# Patient Record
Sex: Male | Born: 1980 | Race: White | Hispanic: No | Marital: Married | Smoking: Former smoker
Health system: Southern US, Community
[De-identification: ages and names within clinical notes are randomized; demographics above are authoritative.]

## PROBLEM LIST (undated history)

## (undated) DIAGNOSIS — E782 Mixed hyperlipidemia: Secondary | ICD-10-CM

## (undated) DIAGNOSIS — I10 Essential (primary) hypertension: Secondary | ICD-10-CM

## (undated) DIAGNOSIS — K219 Gastro-esophageal reflux disease without esophagitis: Secondary | ICD-10-CM

## (undated) DIAGNOSIS — F411 Generalized anxiety disorder: Secondary | ICD-10-CM

## (undated) DIAGNOSIS — E785 Hyperlipidemia, unspecified: Secondary | ICD-10-CM

## (undated) HISTORY — DX: Generalized anxiety disorder: F41.1

## (undated) HISTORY — PX: UPPER GASTROINTESTINAL ENDOSCOPY: SHX188

## (undated) HISTORY — DX: Gastro-esophageal reflux disease without esophagitis: K21.9

## (undated) HISTORY — PX: WISDOM TOOTH EXTRACTION: SHX21

## (undated) HISTORY — DX: Mixed hyperlipidemia: E78.2

## (undated) HISTORY — DX: Essential (primary) hypertension: I10

## (undated) HISTORY — PX: OTHER SURGICAL HISTORY: SHX169

## (undated) HISTORY — DX: Hyperlipidemia, unspecified: E78.5

---

## 2001-11-28 ENCOUNTER — Emergency Department (HOSPITAL_COMMUNITY): Admission: EM | Admit: 2001-11-28 | Discharge: 2001-11-28 | Payer: Self-pay | Admitting: Emergency Medicine

## 2007-04-21 ENCOUNTER — Ambulatory Visit: Payer: Self-pay | Admitting: Sports Medicine

## 2007-04-21 DIAGNOSIS — M79609 Pain in unspecified limb: Secondary | ICD-10-CM | POA: Insufficient documentation

## 2007-04-21 DIAGNOSIS — L84 Corns and callosities: Secondary | ICD-10-CM | POA: Insufficient documentation

## 2007-08-14 ENCOUNTER — Ambulatory Visit: Payer: Self-pay | Admitting: Sports Medicine

## 2007-08-14 DIAGNOSIS — M545 Low back pain, unspecified: Secondary | ICD-10-CM | POA: Insufficient documentation

## 2007-08-14 DIAGNOSIS — R030 Elevated blood-pressure reading, without diagnosis of hypertension: Secondary | ICD-10-CM | POA: Insufficient documentation

## 2007-08-14 DIAGNOSIS — S43429A Sprain of unspecified rotator cuff capsule, initial encounter: Secondary | ICD-10-CM | POA: Insufficient documentation

## 2007-08-14 DIAGNOSIS — M549 Dorsalgia, unspecified: Secondary | ICD-10-CM | POA: Insufficient documentation

## 2011-06-25 DIAGNOSIS — I10 Essential (primary) hypertension: Secondary | ICD-10-CM

## 2011-06-25 HISTORY — DX: Essential (primary) hypertension: I10

## 2016-01-22 DIAGNOSIS — I10 Essential (primary) hypertension: Secondary | ICD-10-CM | POA: Diagnosis not present

## 2016-01-23 DIAGNOSIS — E785 Hyperlipidemia, unspecified: Secondary | ICD-10-CM

## 2016-01-23 HISTORY — DX: Hyperlipidemia, unspecified: E78.5

## 2016-04-16 DIAGNOSIS — Z6825 Body mass index (BMI) 25.0-25.9, adult: Secondary | ICD-10-CM | POA: Diagnosis not present

## 2016-04-16 DIAGNOSIS — Z0000 Encounter for general adult medical examination without abnormal findings: Secondary | ICD-10-CM | POA: Diagnosis not present

## 2016-09-05 DIAGNOSIS — J018 Other acute sinusitis: Secondary | ICD-10-CM | POA: Diagnosis not present

## 2017-01-10 DIAGNOSIS — J342 Deviated nasal septum: Secondary | ICD-10-CM | POA: Diagnosis not present

## 2017-01-10 DIAGNOSIS — K219 Gastro-esophageal reflux disease without esophagitis: Secondary | ICD-10-CM | POA: Diagnosis not present

## 2017-01-10 DIAGNOSIS — I10 Essential (primary) hypertension: Secondary | ICD-10-CM | POA: Diagnosis not present

## 2017-01-10 DIAGNOSIS — E782 Mixed hyperlipidemia: Secondary | ICD-10-CM | POA: Diagnosis not present

## 2017-01-14 ENCOUNTER — Encounter: Payer: Self-pay | Admitting: Physician Assistant

## 2017-01-21 DIAGNOSIS — J029 Acute pharyngitis, unspecified: Secondary | ICD-10-CM | POA: Diagnosis not present

## 2017-01-21 DIAGNOSIS — J342 Deviated nasal septum: Secondary | ICD-10-CM | POA: Diagnosis not present

## 2017-01-28 ENCOUNTER — Encounter: Payer: Self-pay | Admitting: Physician Assistant

## 2017-01-28 ENCOUNTER — Encounter (INDEPENDENT_AMBULATORY_CARE_PROVIDER_SITE_OTHER): Payer: Self-pay

## 2017-01-28 ENCOUNTER — Ambulatory Visit (INDEPENDENT_AMBULATORY_CARE_PROVIDER_SITE_OTHER): Payer: BLUE CROSS/BLUE SHIELD | Admitting: Physician Assistant

## 2017-01-28 VITALS — BP 126/82 | HR 60 | Ht 77.0 in | Wt 221.5 lb

## 2017-01-28 DIAGNOSIS — K219 Gastro-esophageal reflux disease without esophagitis: Secondary | ICD-10-CM

## 2017-01-28 DIAGNOSIS — R490 Dysphonia: Secondary | ICD-10-CM | POA: Diagnosis not present

## 2017-01-28 DIAGNOSIS — R1314 Dysphagia, pharyngoesophageal phase: Secondary | ICD-10-CM

## 2017-01-28 MED ORDER — PANTOPRAZOLE SODIUM 40 MG PO TBEC: 40.0000 mg | DELAYED_RELEASE_TABLET | Freq: Every day | ORAL | 1 refills | Status: DC

## 2017-01-28 NOTE — Patient Instructions (Signed)
We have sent the following medications to your pharmacy for you to pick up at your convenience: Pantoprazole 40 mg daily    Your physician has requested that you go to the basement for the following lab work before leaving today: H pylori stool antigen  We have given you a handout on anti-reflux regimen.

## 2017-01-28 NOTE — Progress Notes (Signed)
Chief Complaint: GERD, Hoarseness, Intermittent Dysphagia  HPI:  Mr. Wiler is a 36 year old Caucasian male who presents to clinic today as a referral from Gus Height, PA-C at [facility], for a complaint of reflux, hoarseness and intermittent dysphagia.   Today, the patient tells me that 2 months ago he started with postnasal drip related to a deviated septum, this seemed to make his throat sore, but he has been to see an ENT and have this evaluated and is now on Flonase. This is under control but patient continues with a daily hoarseness and the ENT suggested this could be from reflux.     Patient also describes a "lump in my throat", apparently 2-3 meals a week seem to get stuck in one spot and he has to take a large gulp of water in order to get them down. Patient has been using when necessary Zantac which "may be help", he has not taken this that frequently.   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 Hoarseness is affecting his speech on the job. Patient also drinks 4-5 craft beers a day due to having 12-year-old twins at home per his report. He plans to start running again soon instead as an outlet.   Patient denies fever, chills, blood in the stool, melena, weight loss, fatigue, anorexia, change in bowel habits, nausea, vomiting or symptoms that awaken him at night.   Past Medical History:  Diagnosis Date  . Hyperlipidemia 01/23/2016  . Hypertension 2013    Past Surgical History:  Procedure Laterality Date  . None      Current Outpatient Prescriptions  Medication Sig Dispense Refill  . nebivolol (BYSTOLIC) 5 MG tablet Take 5 mg by mouth daily.    . ranitidine (ZANTAC) 150 MG tablet Take 150 mg by mouth as needed for heartburn.     No current facility-administered medications for this visit.     Allergies as of 01/28/2017 - never reviewed  Allergen Reaction Noted  . Solarcaine aloe extra [lidocaine] Other (See Comments) 01/28/2017    Family  History  Problem Relation Age of Onset  . Breast cancer Mother   . Healthy Father   . Heart attack Maternal Grandfather   . Dementia Paternal Grandmother   . Other Paternal Grandfather        suicide  . Pancreatic disease Other        great aunt  . Colon cancer Neg Hx   . Rectal cancer Neg Hx   . Esophageal cancer Neg Hx   . Liver cancer Neg Hx     Social History   Social History  . Marital status: Married    Spouse name: N/A  . Number of children: 2  . Years of education: N/A   Occupational History  . Sales    Social History Main Topics  . Smoking status: Former Games developer  . Smokeless tobacco: Former Neurosurgeon    Types: Snuff     Comment: during college  . Alcohol use 2.4 oz/week    4 Cans of beer per week     Comment: 4-5 cans daily  . Drug use: No  . Sexual activity: Not on file   Other Topics Concern  . Not on file   Social History Narrative  . No narrative on file    Review of Systems:    Constitutional: No weight loss, fever or chills Skin: No rash  Cardiovascular: No chest pain Respiratory: No SOB or cough Gastrointestinal: See HPI and  otherwise negative Genitourinary: No dysuria Neurological: No headache, dizziness or syncope Musculoskeletal: No new muscle or joint pain Hematologic: No bleeding  Psychiatric: No history of depression or anxiety   Physical Exam:  Vital signs: BP 126/82   Pulse 60   Ht 6\' 5"  (1.956 m)   Wt 221 lb 8 oz (100.5 kg)   BMI 26.27 kg/m   Constitutional:   Pleasant Caucasian male appears to be in NAD, Well developed, Well nourished, alert and cooperative Head:  Normocephalic and atraumatic. Eyes:   PEERL, EOMI. No icterus. Conjunctiva pink. Ears:  Normal auditory acuity. Neck:  Supple Throat: Oral cavity and pharynx without inflammation, swelling or lesion.  Respiratory: Respirations even and unlabored. Lungs clear to auscultation bilaterally.   No wheezes, crackles, or rhonchi.  Cardiovascular: Normal S1, S2. No MRG.  Regular rate and rhythm. No peripheral edema, cyanosis or pallor.  Gastrointestinal:  Soft, nondistended, nontender. No rebound or guarding. Normal bowel sounds. No appreciable masses or hepatomegaly. Rectal:  Not performed.  Msk:  Symmetrical without gross deformities. Without edema, no deformity or joint abnormality.  Neurologic:  Alert and  oriented x4;  grossly normal neurologically.  Skin:   Dry and intact without significant lesions or rashes. Psychiatric:  Demonstrates good judgement and reason without abnormal affect or behaviors.  No recent labs or imaging  Assessment: 1. GERD: Patient reports a burning sensation up into his throat causing daily hoarseness, worse in the past 2 months 2. Hoarseness: For the past 2 months upon waking, initially started after postnasal drip, consider relation to above 3. Intermittent dysphagia: 2-3 meals a week he tends to have problems with getting stuck on its way down, relieved with a gulp of water; consider esophagitis versus esophageal stricture versus less likely ring versus web versus mass  Plan: 1. Discussed with the patient that we could do an EGD for further evaluation of his symptoms but he declined at this time. 2. Prescribed Pantoprazole 40 mg once daily, 30-60 minutes before eating breakfast #30 with 1 refill 3. Provided the patient with a reflux handout and reviewed antireflux diet and lifestyle recommendations such as decreasing his alcohol intake 4. Patient will follow in clinic in 4-5 weeks with myself or Dr. Lavon PaganiniNandigam, if he continues with symptoms at that time, will discuss EGD  Hyacinth MeekerJennifer Azarel Banner, PA-C [facility] 01/28/2017, 9:13 AM  Cc: Gus HeightAndrea Johnson, PA-C ([facility])

## 2017-01-29 NOTE — Progress Notes (Signed)
Reviewed and agree with documentation and assessment and plan. K. Veena Nandigam , MD   

## 2017-02-19 ENCOUNTER — Other Ambulatory Visit: Payer: BLUE CROSS/BLUE SHIELD

## 2017-02-19 DIAGNOSIS — R1314 Dysphagia, pharyngoesophageal phase: Secondary | ICD-10-CM

## 2017-02-19 DIAGNOSIS — R490 Dysphonia: Secondary | ICD-10-CM

## 2017-02-19 DIAGNOSIS — K219 Gastro-esophageal reflux disease without esophagitis: Secondary | ICD-10-CM

## 2017-02-20 LAB — HELICOBACTER PYLORI  SPECIAL ANTIGEN: "H. PYLORI Antigen ": NOT DETECTED

## 2017-03-24 ENCOUNTER — Other Ambulatory Visit: Payer: Self-pay | Admitting: Physician Assistant

## 2017-04-07 DIAGNOSIS — Z0000 Encounter for general adult medical examination without abnormal findings: Secondary | ICD-10-CM | POA: Diagnosis not present

## 2017-04-07 DIAGNOSIS — J018 Other acute sinusitis: Secondary | ICD-10-CM | POA: Diagnosis not present

## 2017-04-07 DIAGNOSIS — Z6827 Body mass index (BMI) 27.0-27.9, adult: Secondary | ICD-10-CM | POA: Diagnosis not present

## 2017-04-22 ENCOUNTER — Other Ambulatory Visit: Payer: Self-pay

## 2017-04-22 MED ORDER — PANTOPRAZOLE SODIUM 40 MG PO TBEC: 40.0000 mg | DELAYED_RELEASE_TABLET | Freq: Every day | ORAL | 3 refills | Status: DC

## 2017-10-03 DIAGNOSIS — J020 Streptococcal pharyngitis: Secondary | ICD-10-CM | POA: Diagnosis not present

## 2017-10-30 DIAGNOSIS — J00 Acute nasopharyngitis [common cold]: Secondary | ICD-10-CM | POA: Diagnosis not present

## 2018-03-12 ENCOUNTER — Encounter: Payer: Self-pay | Admitting: Physician Assistant

## 2018-03-12 ENCOUNTER — Encounter (INDEPENDENT_AMBULATORY_CARE_PROVIDER_SITE_OTHER): Payer: Self-pay

## 2018-03-12 ENCOUNTER — Other Ambulatory Visit (INDEPENDENT_AMBULATORY_CARE_PROVIDER_SITE_OTHER): Payer: BLUE CROSS/BLUE SHIELD

## 2018-03-12 ENCOUNTER — Ambulatory Visit: Payer: BLUE CROSS/BLUE SHIELD | Admitting: Physician Assistant

## 2018-03-12 VITALS — BP 134/90 | HR 60 | Ht 75.59 in | Wt 234.5 lb

## 2018-03-12 DIAGNOSIS — R194 Change in bowel habit: Secondary | ICD-10-CM | POA: Diagnosis not present

## 2018-03-12 DIAGNOSIS — K649 Unspecified hemorrhoids: Secondary | ICD-10-CM

## 2018-03-12 DIAGNOSIS — R1031 Right lower quadrant pain: Secondary | ICD-10-CM

## 2018-03-12 LAB — IGA: IgA: 190 mg/dL (ref 68–378)

## 2018-03-12 MED ORDER — HYOSCYAMINE SULFATE 0.125 MG SL SUBL: 0.1250 mg | SUBLINGUAL_TABLET | SUBLINGUAL | 1 refills | Status: DC | PRN

## 2018-03-12 MED ORDER — HYDROCORTISONE 2.5 % RE CREA: 1.0000 "application " | TOPICAL_CREAM | Freq: Two times a day (BID) | RECTAL | 1 refills | Status: DC

## 2018-03-12 NOTE — Patient Instructions (Addendum)
Your provider has requested that you go to the basement level for lab work before leaving today. Press "B" on the elevator. The lab is located at the first door on the left as you exit the elevator.  We have sent the following medications to your pharmacy for you to pick up at your convenience:  Hydrocortisone cream twice a day for 7 days  Hyoscyamine 0.125 mg every 4-6 hours as needed  We have given you a high fiber diet handout. Please strive to have 25-30 grams of fiber daily.   Use a daily probiotic such as Align daily for 2 months.

## 2018-03-12 NOTE — Progress Notes (Signed)
Chief Complaint: Abdominal pain, hemorrhoids, varying bowel habits  HPI:    Edward Terry is a 37 year old male with a past medical history as listed below, assigned to Dr. Lavon Paganini at time of patient's last visit, who presents clinic today with a complaint of abdominal pain, hemorrhoids and varying bowel habits.    01/28/2017 office visit with me to discuss GERD, hoarseness and intermittent dysphagia.  At that time, started patient on Pantoprazole 40 mg daily.    Today, patient presents to clinic and has multiple GI complaints.  Over the past couple of months he has had an acute right lower quadrant abdominal pain which seems to come and go.  Tells me that when this comes it can last for a couple of hours.  It is rated as a 2-3/10.  Tells me that if he does not eat anything, he gets very gassy and then this pain is worse.  It is now worrying him because "it is always in the very same spot".  Nothing seems to make it better.  Does hurt somewhat when he gets up and tries to move around.    Also discusses varying bowel habits today.  Tells me that he can have loose stools occasionally after eating certain foods and in fact has started to avoid gluten foods as he feels like this makes his symptoms worse.  Tells me that he can have 4-6 stools typically in the morning and afterwards he is fine all day.  Does report excess bloating and gas.  Associated symptoms include a "sick feeling when I am hungry" this has been occurring over the past couple of months too.     Also discusses some occasional bright red blood and itching, painful hemorrhoids which come and go.  He typically uses Preparation H over-the-counter which resolves his symptoms after about a week.     Patient has questions regarding food allergies.     Interestingly patient had to stop exercising about 3 to 4 months ago because of plantar fasciitis and believes that a lot of his symptoms started around the same time.  Social history positive for the  patient and his wife going on a trip to [place] in a couple of weeks.  He would like to feel well then.  (The twins are staying at home).     Denies fever, chills, weight loss, anorexia, nausea, vomiting, heartburn, reflux or symptoms that awaken him from sleep.  Past Medical History:  Diagnosis Date  . Hyperlipidemia 01/23/2016  . Hypertension 2013    Past Surgical History:  Procedure Laterality Date  . None      Current Outpatient Medications  Medication Sig Dispense Refill  . atorvastatin (LIPITOR) 10 MG tablet Take 1 tablet by mouth daily.  0  . nebivolol (BYSTOLIC) 5 MG tablet Take 5 mg by mouth daily.    . pantoprazole (PROTONIX) 40 MG tablet Take 1 tablet (40 mg total) by mouth daily. 30-60 mins before breakfast 90 tablet 3   No current facility-administered medications for this visit.     Allergies as of 03/12/2018 - Review Complete 03/12/2018  Allergen Reaction Noted  . Solarcaine aloe extra [lidocaine] Other (See Comments) 01/28/2017    Family History  Problem Relation Age of Onset  . Breast cancer Mother   . Healthy Father   . Heart attack Maternal Grandfather   . Dementia Paternal Grandmother   . Other Paternal Grandfather        suicide  . Pancreatic disease Other  great aunt  . Colon cancer Neg Hx   . Rectal cancer Neg Hx   . Esophageal cancer Neg Hx   . Liver cancer Neg Hx     Social History   Socioeconomic History  . Marital status: Married    Spouse name: Not on file  . Number of children: 2  . Years of education: Not on file  . Highest education level: Not on file  Occupational History  . Occupation: Airline pilot  Social Needs  . Financial resource strain: Not on file  . Food insecurity:    Worry: Not on file    Inability: Not on file  . Transportation needs:    Medical: Not on file    Non-medical: Not on file  Tobacco Use  . Smoking status: Former Games developer  . Smokeless tobacco: Former Neurosurgeon    Types: Snuff  . Tobacco comment: during  college  Substance and Sexual Activity  . Alcohol use: Yes    Alcohol/week: 4.0 standard drinks    Types: 4 Cans of beer per week    Comment: 4-5 cans daily  . Drug use: No  . Sexual activity: Not on file  Lifestyle  . Physical activity:    Days per week: Not on file    Minutes per session: Not on file  . Stress: Not on file  Relationships  . Social connections:    Talks on phone: Not on file    Gets together: Not on file    Attends religious service: Not on file    Active member of club or organization: Not on file    Attends meetings of clubs or organizations: Not on file    Relationship status: Not on file  . Intimate partner violence:    Fear of current or ex partner: Not on file    Emotionally abused: Not on file    Physically abused: Not on file    Forced sexual activity: Not on file  Other Topics Concern  . Not on file  Social History Narrative  . Not on file    Review of Systems:    Constitutional: No weight loss, fever or chills Cardiovascular: No chest pain Respiratory: No SOB Gastrointestinal: See HPI and otherwise negative   Physical Exam:  Vital signs: BP 134/90 (BP Location: Left Arm, Patient Position: Sitting, Cuff Size: Normal)   Pulse 60   Ht 6' 3.59" (1.92 m)   Wt 234 lb 8 oz (106.4 kg)   BMI 28.85 kg/m   Constitutional:   Pleasant Caucasian male appears to be in NAD, Well developed, Well nourished, alert and cooperative Respiratory: Respirations even and unlabored. Lungs clear to auscultation bilaterally.   No wheezes, crackles, or rhonchi.  Cardiovascular: Normal S1, S2. No MRG. Regular rate and rhythm. No peripheral edema, cyanosis or pallor.  Gastrointestinal:  Soft, nondistended, nontender. No rebound or guarding. Normal bowel sounds. No appreciable masses or hepatomegaly. Rectal: Patient declined Psychiatric: Demonstrates good judgement and reason without abnormal affect or behaviors.  No recent labs or imaging.  Assessment: 1.  Change  in bowel habits: Describes constant variation in his stool for many years, seems to be related to what he is eating but he is unsure exactly what causes it, sometimes multiple loose stools in the morning varying with soft solid formed stools, sometimes associated with abdominal pain; consider IBS most likely 2.  Hemorrhoids: Described by the patient as itchy, typically helped by Preparation H for a period of time, declines exam today 3.  Right lower quadrant abdominal pain: Rated as a 2-3/10 occurs intermittently throughout the day, seems to be a few hours after eating, worse over past 3 mos when he has not been exercising; most likely IBS  Plan: 1.  Prescribed Hydrocortisone ointment to be applied to hemorrhoids twice daily x7 days with 1 refill. 2.  Recommend the patient increase fiber in his diet to at least 25-35 g/day with use of fiber supplement and/or through his diet.  Provided him with a high-fiber diet handout. 3.  Recommend the patient start a daily probiotic such as Align once daily x2 months.  Did provide him with a coupon. 4.  Ordered celiac studies 5.  Prescribed Hyoscyamine sulfate 0.125 mg every 4-6 hours as needed for this abdominal pain #60 with 1 refill 6.  Provided patient with a copy of the low FODMAP diet and discussed.  If he does not want to stay on this diet strictly he can at least look at the "do not eat" side to see if there is anything that he is eating a lot of and try to limit this. 7.  Patient to follow in clinic in 6-8 weeks or sooner if necessary with myself or Dr. Heidi DachNandigam  Jennifer Lemmon, PA-C [facility] 03/12/2018, 11:15 AM

## 2018-03-13 LAB — TISSUE TRANSGLUTAMINASE, IGA: (TTG) AB, IGA: 1 U/mL

## 2018-03-19 NOTE — Progress Notes (Signed)
Reviewed and agree with documentation and assessment and plan. K. Veena Nandigam , MD   

## 2018-04-07 DIAGNOSIS — Z0000 Encounter for general adult medical examination without abnormal findings: Secondary | ICD-10-CM | POA: Diagnosis not present

## 2018-04-07 DIAGNOSIS — M722 Plantar fascial fibromatosis: Secondary | ICD-10-CM | POA: Diagnosis not present

## 2018-04-07 DIAGNOSIS — Z6828 Body mass index (BMI) 28.0-28.9, adult: Secondary | ICD-10-CM | POA: Diagnosis not present

## 2018-04-18 ENCOUNTER — Other Ambulatory Visit: Payer: Self-pay | Admitting: Gastroenterology

## 2018-05-12 ENCOUNTER — Other Ambulatory Visit: Payer: Self-pay | Admitting: Physician Assistant

## 2018-05-13 ENCOUNTER — Encounter: Payer: Self-pay | Admitting: Podiatry

## 2018-05-13 ENCOUNTER — Ambulatory Visit: Payer: BLUE CROSS/BLUE SHIELD | Admitting: Podiatry

## 2018-05-13 ENCOUNTER — Ambulatory Visit (INDEPENDENT_AMBULATORY_CARE_PROVIDER_SITE_OTHER): Payer: BLUE CROSS/BLUE SHIELD

## 2018-05-13 DIAGNOSIS — M722 Plantar fascial fibromatosis: Secondary | ICD-10-CM

## 2018-05-13 MED ORDER — PREDNISONE 10 MG PO TABS: ORAL_TABLET | ORAL | 0 refills | Status: DC

## 2018-05-13 MED ORDER — TRIAMCINOLONE ACETONIDE 10 MG/ML IJ SUSP
10.0000 mg | Freq: Once | INTRAMUSCULAR | Status: AC
  Administered 2018-05-13: 10 mg

## 2018-05-13 NOTE — Progress Notes (Signed)
Subjective:   Patient ID: Edward MemosBenjamin H Berhow, male   DOB: 37 y.o.   MRN: [id number]   HPI Patient presents stating he has had heel pain for 6 months and is gradually made it harder for him to wear shoe gear and to walk comfortably.  He has 2 young children has trouble keeping up with them due to the pain and states is worse when he gets up in the morning and after periods of sitting.  Patient does not smoke and likes to be active   Review of Systems  All other systems reviewed and are negative.       Objective:  Physical Exam  Constitutional: He appears well-developed and well-nourished.  Cardiovascular: Intact distal pulses.  Pulmonary/Chest: Effort normal.  Musculoskeletal: Normal range of motion.  Neurological: He is alert.  Skin: Skin is warm.  Nursing note and vitals reviewed.   Neurovascular status found to be intact muscle strength is adequate range of motion within normal limits with patient found to have exquisite discomfort plantar aspect left heel at the insertional point tendon into the calcaneus with inflammation fluid around the medial band.  Patient's found to have good digital perfusion is well oriented x3 with moderate depression of the arch     Assessment:  Acute plantar fasciitis left with inflammation fluid around the medial band     Plan:  H&P x-ray reviewed and today I injected the plantar fascial left 3 mg Kenalog 5 mg Xylocaine applied fascial brace with instructions on usage and placed on 12-day Sterapred DS Dosepak.  Reappoint 2 weeks to recheck and gave instructions on physical therapy  X-ray indicates large spur with no indications of stress fracture arthritis and I did discuss orthotics for the long-term with patient after were able to get this better

## 2018-05-13 NOTE — Patient Instructions (Signed)

## 2018-05-13 NOTE — Progress Notes (Signed)
   Subjective:    Patient ID: Edward Terry, male    DOB: 1980/09/10, 37 y.o.   MRN: [id number]  HPI    Review of Systems  All other systems reviewed and are negative.      Objective:   Physical Exam        Assessment & Plan:

## 2018-05-27 ENCOUNTER — Ambulatory Visit (INDEPENDENT_AMBULATORY_CARE_PROVIDER_SITE_OTHER): Payer: BLUE CROSS/BLUE SHIELD | Admitting: Podiatry

## 2018-05-27 ENCOUNTER — Encounter: Payer: Self-pay | Admitting: Podiatry

## 2018-05-27 DIAGNOSIS — M722 Plantar fascial fibromatosis: Secondary | ICD-10-CM

## 2018-05-27 MED ORDER — DICLOFENAC SODIUM 75 MG PO TBEC: 75.0000 mg | DELAYED_RELEASE_TABLET | Freq: Two times a day (BID) | ORAL | 2 refills | Status: DC

## 2018-05-27 MED ORDER — TRIAMCINOLONE ACETONIDE 10 MG/ML IJ SUSP
10.0000 mg | Freq: Once | INTRAMUSCULAR | Status: AC
  Administered 2018-05-27: 10 mg

## 2018-05-28 NOTE — Progress Notes (Signed)
Subjective:   Patient ID: Edward Terry, male   DOB: 37 y.o.   MRN: [id number]   HPI Patient states that heel is still very sore and only had minimal response to the medication and its increasingly hard for him to be active   ROS      Objective:  Physical Exam  Neurovascular status intact with continued exquisite discomfort plantar aspect left heel at the insertional point tendon into the calcaneus     Assessment:  Acute plantar fasciitis that so far has not responded to conservative treatment     Plan:  Recommended immobilization and applied air fracture walker to completely take all pressure off the bottom of the foot went ahead today and did inject the plantar fascia 3 mg Kenalog 5 mg Xylocaine and reappoint 2 weeks and discussed possibility for surgical or shockwave therapy depending on response

## 2018-06-10 ENCOUNTER — Ambulatory Visit: Payer: BLUE CROSS/BLUE SHIELD | Admitting: Podiatry

## 2018-06-26 ENCOUNTER — Ambulatory Visit (INDEPENDENT_AMBULATORY_CARE_PROVIDER_SITE_OTHER): Payer: BLUE CROSS/BLUE SHIELD | Admitting: Podiatry

## 2018-06-26 ENCOUNTER — Encounter: Payer: Self-pay | Admitting: Podiatry

## 2018-06-26 DIAGNOSIS — M722 Plantar fascial fibromatosis: Secondary | ICD-10-CM | POA: Diagnosis not present

## 2018-06-27 NOTE — Progress Notes (Signed)
Subjective:   Patient ID: Edward Terry, male   DOB: 38 y.o.   MRN: [id number]   HPI Patient states the boot was very effective with mild reoccurrence of pain since have stopped wearing it and I am still wearing it at different times   ROS      Objective:  Physical Exam  Neurovascular status intact with discomfort in the plantar fascial left still present with moderate improvement from previous but still painful upon palpation especially when getting up and after periods of sitting     Assessment:  Acute plantar fasciitis still noted heel with improvement with boot but concerned about the long-term     Plan:  H&P discussed condition and at this point will get a continue conservative but I did explain to him the possibilities at one point for surgical intervention.  At this point I did dispense a night splint with all instructions on usage and I want him using it daily along with aggressive ice therapy and gradual reduction in boot usage.  Patient will be seen back if symptoms were to recur or get worse and ultimately may require surgical intervention

## 2018-11-20 DIAGNOSIS — I10 Essential (primary) hypertension: Secondary | ICD-10-CM | POA: Diagnosis not present

## 2018-11-20 DIAGNOSIS — F411 Generalized anxiety disorder: Secondary | ICD-10-CM | POA: Diagnosis not present

## 2018-11-20 DIAGNOSIS — K219 Gastro-esophageal reflux disease without esophagitis: Secondary | ICD-10-CM | POA: Diagnosis not present

## 2018-11-20 DIAGNOSIS — E782 Mixed hyperlipidemia: Secondary | ICD-10-CM | POA: Diagnosis not present

## 2019-01-07 ENCOUNTER — Telehealth: Payer: Self-pay | Admitting: Cardiology

## 2019-01-07 NOTE — Telephone Encounter (Signed)

## 2019-01-08 ENCOUNTER — Ambulatory Visit (INDEPENDENT_AMBULATORY_CARE_PROVIDER_SITE_OTHER): Payer: BLUE CROSS/BLUE SHIELD | Admitting: Cardiology

## 2019-01-08 ENCOUNTER — Other Ambulatory Visit: Payer: Self-pay

## 2019-01-08 ENCOUNTER — Encounter: Payer: Self-pay | Admitting: Cardiology

## 2019-01-08 VITALS — BP 150/84 | HR 64 | Ht 77.0 in | Wt 228.0 lb

## 2019-01-08 DIAGNOSIS — Z8249 Family history of ischemic heart disease and other diseases of the circulatory system: Secondary | ICD-10-CM

## 2019-01-08 DIAGNOSIS — Z7189 Other specified counseling: Secondary | ICD-10-CM

## 2019-01-08 DIAGNOSIS — I868 Varicose veins of other specified sites: Secondary | ICD-10-CM

## 2019-01-08 DIAGNOSIS — I10 Essential (primary) hypertension: Secondary | ICD-10-CM | POA: Diagnosis not present

## 2019-01-08 DIAGNOSIS — Z79899 Other long term (current) drug therapy: Secondary | ICD-10-CM

## 2019-01-08 DIAGNOSIS — R0789 Other chest pain: Secondary | ICD-10-CM | POA: Diagnosis not present

## 2019-01-08 MED ORDER — LISINOPRIL 10 MG PO TABS: 10.0000 mg | ORAL_TABLET | Freq: Every day | ORAL | 11 refills | Status: DC

## 2019-01-08 NOTE — Progress Notes (Signed)
Cardiology Office Note:    Date:  01/08/2019   ID:  Edward MemosBenjamin H Gotsch, DOB February 27, 1981, MRN [id number]  PCP:  [facility]  Cardiologist:  Jodelle RedBridgette Baley Lorimer, MD PhD  Referring MD: Gus HeightJohnson, Andrea, PA-C   Chief Complaint  Patient presents with  . Hypertension  . Hyperlipidemia    History of Present Illness:    Edward Terry is a 38 y.o. male with a hx of  who is seen as a new consult at the request of Gus HeightJohnson, Andrea, Cordelia Poche-C for the evaluation and management of hypertension and hyperlipidemia.  Today:  -has varicose veins. Worried about vascular health. Always had cold hands, how can he know that this isn't his heart/vascular system -has a long history of point tenderness, including across his neck, back, and chest -wants to make sure that he has his cardiovascular risk factors well managed  Cardiovascular risk factors: Prior clinical ASCVD: none Comorbid conditions: hypertension, hyperlipidemia. Metabolic syndrome/Obesity: BMI 27, not obese Chronic inflammatory conditions: none Tobacco use history: former smoker, quit >15 years ago Family history: Mat Gpa had 3V bypass, mother has irregular heart rhythm Prior cardiac testing and/or incidental findings on other testing (ie coronary calcium): ECG, stress test somewhere in [place] years ago, was normal Exercise level: not routinely active. Used to run [employer], but since foot injury and with two twin 10678 year old boys has no time. No limitations to activity when he wants to do it. Can hike, walk, climb stairs. Current diet: diet is fine except for beer. He cooks all meals, generally healthy with some dairy, but no high sugar, etc. Alcohol: was a big beer drinker in college, then minimal use for a while when very active, and now back to drinking more. Drinking 6 high alcohol beers/day . Feels withdrawal symptoms when he doesn't drink.   Hyperlipidemia/hypertension: On atorvastatin 10 mg and bystolic 5 mg daily.  Doesn't think he has been on other meds for BP in the past. Discussed guideline recommendations for HTN. Discussed other indications for beta blockers, does have anxiety/performance anxiety. Has some fatigue as well though this is not severe.   Chest pain: Occasional quick poke on the left, and sometimes right, chest and lateral wall areas. Related to posture, movement, etc. Has prior stress test for this, was told it was normal. Has had neck imaging with abnormalities.  Denies shortness of breath at rest or with normal exertion. No PND, orthopnea, or unexpected weight gain. No syncope or palpitations. Does have superficial varicose veins and occasional mild LE edema.  Past Medical History:  Diagnosis Date  . Hyperlipidemia 01/23/2016  . Hypertension 2013    Past Surgical History:  Procedure Laterality Date  . None      Current Medications: Current Outpatient Medications on File Prior to Visit  Medication Sig  . atorvastatin (LIPITOR) 10 MG tablet Take 1 tablet by mouth daily.  . hydrocortisone (ANUSOL-HC) 2.5 % rectal cream Place 1 application rectally 2 (two) times daily.  . nebivolol (BYSTOLIC) 5 MG tablet Take 5 mg by mouth daily.  . pantoprazole (PROTONIX) 40 MG tablet TAKE 1 TABLET BY MOUTH DAILY. 30-60 MINS BEFORE BREAKFAST   No current facility-administered medications on file prior to visit.      Allergies:   Solarcaine aloe extra [lidocaine]   Social History   Socioeconomic History  . Marital status: Married    Spouse name: Not on file  . Number of children: 2  . Years of education: Not on file  .  Highest education level: Not on file  Occupational History  . Occupation: Airline pilotales  Social Needs  . Financial resource strain: Not on file  . Food insecurity    Worry: Not on file    Inability: Not on file  . Transportation needs    Medical: Not on file    Non-medical: Not on file  Tobacco Use  . Smoking status: Former Games developermoker  . Smokeless tobacco: Former NeurosurgeonUser     Types: Snuff  . Tobacco comment: during college  Substance and Sexual Activity  . Alcohol use: Yes    Alcohol/week: 4.0 standard drinks    Types: 4 Cans of beer per week    Comment: 4-5 cans daily  . Drug use: No  . Sexual activity: Not on file  Lifestyle  . Physical activity    Days per week: Not on file    Minutes per session: Not on file  . Stress: Not on file  Relationships  . Social Musicianconnections    Talks on phone: Not on file    Gets together: Not on file    Attends religious service: Not on file    Active member of club or organization: Not on file    Attends meetings of clubs or organizations: Not on file    Relationship status: Not on file  Other Topics Concern  . Not on file  Social History Narrative  . Not on file     Family History: The patient's family history includes Breast cancer in his mother; Dementia in his paternal grandmother; Healthy in his father; Heart attack in his maternal grandfather; Other in his paternal grandfather; Pancreatic disease in an other family member. There is no history of Colon cancer, Rectal cancer, Esophageal cancer, or Liver cancer.  ROS:   Please see the history of present illness.  Additional pertinent ROS:  Constitutional: Negative for chills, fever, night sweats, unintentional weight loss  HENT: Negative for ear pain and hearing loss.   Eyes: Negative for loss of vision and eye pain.  Respiratory: Negative for cough, sputum, shortness of breath, wheezing.   Cardiovascular: See HPI. Gastrointestinal: Negative for abdominal pain, melena, and hematochezia.  Genitourinary: Negative for dysuria and hematuria.  Musculoskeletal: Negative for falls and myalgias.  Skin: Negative for itching and rash.  Neurological: Negative for focal weakness, focal sensory changes and loss of consciousness.  Endo/Heme/Allergies: Does not bruise/bleed easily.    EKGs/Labs/Other Studies Reviewed:    The following studies were reviewed today: Notes  from PCP office 11/20/2018  EKG:  EKG is personally reviewed.  The ekg ordered today demonstrates NSR  Recent Labs: No results found for requested labs within last 8760 hours.  Recent Lipid Panel No results found for: CHOL, TRIG, HDL, CHOLHDL, VLDL, LDLCALC, LDLDIRECT  Physical Exam:    VS:  BP (!) 150/84   Pulse 64   Ht 6\' 5"  (1.956 m)   Wt 228 lb (103.4 kg)   SpO2 99%   BMI 27.04 kg/m     Wt Readings from Last 3 Encounters:  01/08/19 228 lb (103.4 kg)  03/12/18 234 lb 8 oz (106.4 kg)  01/28/17 221 lb 8 oz (100.5 kg)    Recheck BP similar  GEN: Well nourished, well developed in no acute distress HEENT: Normal NECK: No JVD; No carotid bruits LYMPHATICS: No lymphadenopathy CARDIAC: regular rhythm, normal S1 and S2, no murmurs, rubs, gallops. Radial and DP pulses 2+ bilaterally. RESPIRATORY:  Clear to auscultation without rales, wheezing or rhonchi  ABDOMEN:  Soft, non-tender, non-distended MUSCULOSKELETAL:  No edema; No deformity. Several prominent superficial varicose veins. SKIN: Warm and dry NEUROLOGIC:  Alert and oriented x 3 PSYCHIATRIC:  Normal affect   ASSESSMENT:    1. Cardiac risk counseling   2. Medication management   3. Essential hypertension   4. Other chest pain   5. Family history of heart disease   6. Counseling on health promotion and disease prevention   7. Spider varicose vein    PLAN:    Cardiac risk counseling and prevention recommendations: -recommend heart healthy/Mediterranean diet, with whole grains, fruits, vegetable, fish, lean meats, nuts, and olive oil. Limit salt. -recommend moderate walking, 3-5 times/week for 30-50 minutes each session. Aim for at least 150 minutes.week. Goal should be pace of 3 miles/hours, or walking 1.5 miles in 30 minutes -recommend avoidance of tobacco products.  -Avoid excess alcohol. He would qualify as a heavy alcohol user, and with reported symptoms of withdrawal, he would benefit from discussion of a tapered  plan to avoid withdrawal symptoms. He understands and agrees. I counseled at length that his self reported anxiety and self treatment with alcohol would be better managed by speaking to a behavioral health counselor and/or using dedicated medications for this. He plans to speak about this to his PCP -Additional risk factor control:  -Diabetes: A1c is not available. No history.  -Lipids: Tchol 151, HDL 47, LDL 91, TG 65  -Blood pressure control: see below  -Weight: BMI 27 -ASCVD risk score: 2.1% (used age of 53 as it is almost his birthday)  Atypical chest pain: given age, low ASCVD risk score, atypical nature of the pain, and history of neck/nerve syndrome, low suspicion that this is cardiac in nature. -instructed on red flag warning symptoms/when to see medical attention  Varicose veins/vascular concerns: superficial varicose veins. Warm extremities with strong pulses. Discussed the etiology of this as well as management.  Hypertension: elevated today, not at goal of <130/80 on beta blocker. This is currently his greatest CV risk factor. Discussed guideline recommendations re: antihypertensives. He is already concerned about superficial varicose veins and occasional LE edema, so will avoid CCB. Discussed thiazide diuretics and ACEi/ARB. Discussed pros/cons of these. He would like to start lisinopril. Once at goal, could consider weaning bystolic (especially if anxiety is under better control). He is amenable to this. -starting lisinopril 10 mg daily -follow up BMET 2 weeks later -he plans to then follow up with PCP. If well controlled, would consider titrating off bystolic  Plan for follow up: as needed. Happy to follow up with any additional questions or concerns.  Medication Adjustments/Labs and Tests Ordered: Current medicines are reviewed at length with the patient today.  Concerns regarding medicines are outlined above.  Orders Placed This Encounter  Procedures  . Basic metabolic panel    Meds ordered this encounter  Medications  . lisinopril (ZESTRIL) 10 MG tablet    Sig: Take 1 tablet (10 mg total) by mouth daily.    Dispense:  30 tablet    Refill:  11    Patient Instructions  Medication Instructions:  Start: Lisinopril 10 mg daily  If you need a refill on your cardiac medications before your next appointment, please call your pharmacy.   Lab work: Your physician recommends that you return for lab work in 2 week (BMP)  If you have labs (blood work) drawn today and your tests are completely normal, you will receive your results only by: Marland Kitchen MyChart Message (if you have MyChart)  OR . A paper copy in the mail If you have any lab test that is abnormal or we need to change your treatment, we will call you to review the results.  Testing/Procedures: None  Follow-Up: At [facility], you and your health needs are our priority.  As part of our continuing mission to provide you with exceptional heart care, we have created designated Provider Care Teams.  These Care Teams include your primary Cardiologist (physician) and Advanced Practice Providers (APPs -  Physician Assistants and Nurse Practitioners) who all work together to provide you with the care you need, when you need it. You will need a follow up appointment as needed   Please call our office 2 months in advance to schedule this appointment.  You may see Dr. Cristal Deerhristopher or one of the following Advanced Practice Providers on your designated Care Team:   Theodore DemarkRhonda Barrett, PA-C . Joni ReiningKathryn Lawrence, DNP, ANP      Signed, Jodelle RedBridgette Yan Pankratz, MD PhD 01/08/2019 7:53 PM    [facility]

## 2019-01-08 NOTE — Patient Instructions (Signed)
Medication Instructions:  Start: Lisinopril 10 mg daily  If you need a refill on your cardiac medications before your next appointment, please call your pharmacy.   Lab work: Your physician recommends that you return for lab work in 2 week (BMP)  If you have labs (blood work) drawn today and your tests are completely normal, you will receive your results only by: Marland Kitchen MyChart Message (if you have MyChart) OR . A paper copy in the mail If you have any lab test that is abnormal or we need to change your treatment, we will call you to review the results.  Testing/Procedures: None  Follow-Up: At [facility], you and your health needs are our priority.  As part of our continuing mission to provide you with exceptional heart care, we have created designated Provider Care Teams.  These Care Teams include your primary Cardiologist (physician) and Advanced Practice Providers (APPs -  Physician Assistants and Nurse Practitioners) who all work together to provide you with the care you need, when you need it. You will need a follow up appointment as needed   Please call our office 2 months in advance to schedule this appointment.  You may see Dr. Harrell Gave or one of the following Advanced Practice Providers on your designated Care Team:   Rosaria Ferries, PA-C . Jory Sims, DNP, ANP

## 2019-01-20 NOTE — Addendum Note (Signed)
Addended by: Crissie Reese on: 01/20/2019 10:37 AM   Modules accepted: Orders

## 2019-01-22 DIAGNOSIS — R05 Cough: Secondary | ICD-10-CM | POA: Diagnosis not present

## 2019-01-22 DIAGNOSIS — Z20828 Contact with and (suspected) exposure to other viral communicable diseases: Secondary | ICD-10-CM | POA: Diagnosis not present

## 2019-02-09 DIAGNOSIS — Z79899 Other long term (current) drug therapy: Secondary | ICD-10-CM | POA: Diagnosis not present

## 2019-02-10 LAB — BASIC METABOLIC PANEL
BUN/Creatinine Ratio: 13 (ref 9–20)
BUN: 13 mg/dL (ref 6–24)
CO2: 24 mmol/L (ref 20–29)
Calcium: 9.7 mg/dL (ref 8.7–10.2)
Chloride: 97 mmol/L (ref 96–106)
Creatinine, Ser: 0.99 mg/dL (ref 0.76–1.27)
GFR calc Af Amer: 110 mL/min/{1.73_m2} (ref >59)
GFR calc non Af Amer: 95 mL/min/{1.73_m2} (ref >59)
Glucose: 86 mg/dL (ref 65–99)
Potassium: 5.3 mmol/L — ABNORMAL HIGH (ref 3.5–5.2)
Sodium: 138 mmol/L (ref 134–144)

## 2019-02-12 ENCOUNTER — Other Ambulatory Visit: Payer: Self-pay

## 2019-02-12 MED ORDER — CHLORTHALIDONE 25 MG PO TABS: 25.0000 mg | ORAL_TABLET | Freq: Every day | ORAL | 11 refills | Status: DC

## 2019-02-28 ENCOUNTER — Other Ambulatory Visit: Payer: Self-pay | Admitting: Physician Assistant

## 2019-06-03 DIAGNOSIS — Z20828 Contact with and (suspected) exposure to other viral communicable diseases: Secondary | ICD-10-CM | POA: Diagnosis not present

## 2019-07-15 DIAGNOSIS — Z20828 Contact with and (suspected) exposure to other viral communicable diseases: Secondary | ICD-10-CM | POA: Diagnosis not present

## 2019-07-19 DIAGNOSIS — I10 Essential (primary) hypertension: Secondary | ICD-10-CM | POA: Diagnosis not present

## 2019-07-19 DIAGNOSIS — E782 Mixed hyperlipidemia: Secondary | ICD-10-CM | POA: Diagnosis not present

## 2019-07-19 DIAGNOSIS — F411 Generalized anxiety disorder: Secondary | ICD-10-CM | POA: Diagnosis not present

## 2019-07-19 DIAGNOSIS — K219 Gastro-esophageal reflux disease without esophagitis: Secondary | ICD-10-CM | POA: Diagnosis not present

## 2019-08-31 ENCOUNTER — Other Ambulatory Visit: Payer: Self-pay

## 2019-08-31 DIAGNOSIS — E782 Mixed hyperlipidemia: Secondary | ICD-10-CM

## 2019-08-31 DIAGNOSIS — I10 Essential (primary) hypertension: Secondary | ICD-10-CM

## 2019-09-01 ENCOUNTER — Other Ambulatory Visit: Payer: BLUE CROSS/BLUE SHIELD

## 2019-09-01 ENCOUNTER — Other Ambulatory Visit: Payer: Self-pay

## 2019-09-01 DIAGNOSIS — F411 Generalized anxiety disorder: Secondary | ICD-10-CM

## 2019-09-01 DIAGNOSIS — E782 Mixed hyperlipidemia: Secondary | ICD-10-CM

## 2019-09-01 DIAGNOSIS — I10 Essential (primary) hypertension: Secondary | ICD-10-CM

## 2019-09-01 MED ORDER — ATORVASTATIN CALCIUM 10 MG PO TABS: 10.0000 mg | ORAL_TABLET | Freq: Every day | ORAL | 2 refills | Status: DC

## 2019-09-01 MED ORDER — CLONAZEPAM 0.5 MG PO TABS: 0.5000 mg | ORAL_TABLET | Freq: Every day | ORAL | 2 refills | Status: DC | PRN

## 2019-09-01 NOTE — Telephone Encounter (Signed)
Patient called requesting a medication refill.  1) Medication(s) Requested (by name):  Atorvastatin 10mg , and                                                                Clonazepam 0.5mg  daily PRN   2) Pharmacy of Choice: [facility])   Approved medications will be sent to the pharmacy, we will reach out if there is an issue.  Requests made after 3pm may not be addressed until the following business day!  If a patient is unsure of the name of the medication(s) please note and ask patient to call back when they are able to provide all info, do not send to responsible party until all information is available!

## 2019-09-02 LAB — CBC WITH DIFFERENTIAL/PLATELET
Basophils Absolute: 0.1 10*3/uL (ref 0.0–0.2)
Basos: 1 %
EOS (ABSOLUTE): 0.3 10*3/uL (ref 0.0–0.4)
Eos: 5 %
Hematocrit: 44.7 % (ref 37.5–51.0)
Hemoglobin: 15.2 g/dL (ref 13.0–17.7)
Immature Grans (Abs): 0 10*3/uL (ref 0.0–0.1)
Immature Granulocytes: 0 %
Lymphocytes Absolute: 2.2 10*3/uL (ref 0.7–3.1)
Lymphs: 38 %
MCH: 31 pg (ref 26.6–33.0)
MCHC: 34 g/dL (ref 31.5–35.7)
MCV: 91 fL (ref 79–97)
Monocytes Absolute: 0.5 10*3/uL (ref 0.1–0.9)
Monocytes: 9 %
Neutrophils Absolute: 2.7 10*3/uL (ref 1.4–7.0)
Neutrophils: 47 %
Platelets: 277 10*3/uL (ref 150–450)
RBC: 4.9 x10E6/uL (ref 4.14–5.80)
RDW: 12.5 % (ref 11.6–15.4)
WBC: 5.8 10*3/uL (ref 3.4–10.8)

## 2019-09-02 LAB — COMPREHENSIVE METABOLIC PANEL
ALT: 27 IU/L (ref 0–44)
AST: 25 IU/L (ref 0–40)
Albumin/Globulin Ratio: 1.7 (ref 1.2–2.2)
Albumin: 4.7 g/dL (ref 4.0–5.0)
Alkaline Phosphatase: 58 IU/L (ref 39–117)
BUN/Creatinine Ratio: 11 (ref 9–20)
BUN: 11 mg/dL (ref 6–24)
Bilirubin Total: 0.7 mg/dL (ref 0.0–1.2)
CO2: 29 mmol/L (ref 20–29)
Calcium: 10 mg/dL (ref 8.7–10.2)
Chloride: 92 mmol/L — ABNORMAL LOW (ref 96–106)
Creatinine, Ser: 0.98 mg/dL (ref 0.76–1.27)
GFR calc Af Amer: 111 mL/min/{1.73_m2} (ref >59)
GFR calc non Af Amer: 96 mL/min/{1.73_m2} (ref >59)
Globulin, Total: 2.7 g/dL (ref 1.5–4.5)
Glucose: 98 mg/dL (ref 65–99)
Potassium: 5 mmol/L (ref 3.5–5.2)
Sodium: 136 mmol/L (ref 134–144)
Total Protein: 7.4 g/dL (ref 6.0–8.5)

## 2019-09-02 LAB — CARDIOVASCULAR RISK ASSESSMENT

## 2019-09-02 LAB — LIPID PANEL W/O CHOL/HDL RATIO
Cholesterol, Total: 197 mg/dL (ref 100–199)
HDL: 60 mg/dL (ref >39)
LDL Chol Calc (NIH): 118 mg/dL — ABNORMAL HIGH (ref 0–99)
Triglycerides: 107 mg/dL (ref 0–149)
VLDL Cholesterol Cal: 19 mg/dL (ref 5–40)

## 2019-09-03 NOTE — Progress Notes (Signed)
Cholesterol LDL 118- start low cholesterol diet, Kidney and liver tests normal, CBC normal lp

## 2019-12-15 ENCOUNTER — Encounter: Payer: Self-pay | Admitting: Legal Medicine

## 2019-12-15 ENCOUNTER — Ambulatory Visit: Payer: BLUE CROSS/BLUE SHIELD | Admitting: Legal Medicine

## 2019-12-15 ENCOUNTER — Other Ambulatory Visit: Payer: Self-pay

## 2019-12-15 VITALS — BP 122/80 | HR 67 | Temp 97.3°F | Resp 16 | Ht 77.0 in | Wt 214.4 lb

## 2019-12-15 DIAGNOSIS — K521 Toxic gastroenteritis and colitis: Secondary | ICD-10-CM

## 2019-12-15 DIAGNOSIS — K219 Gastro-esophageal reflux disease without esophagitis: Secondary | ICD-10-CM

## 2019-12-15 DIAGNOSIS — E782 Mixed hyperlipidemia: Secondary | ICD-10-CM | POA: Insufficient documentation

## 2019-12-15 DIAGNOSIS — I10 Essential (primary) hypertension: Secondary | ICD-10-CM | POA: Insufficient documentation

## 2019-12-15 DIAGNOSIS — F411 Generalized anxiety disorder: Secondary | ICD-10-CM | POA: Insufficient documentation

## 2019-12-15 LAB — COMPREHENSIVE METABOLIC PANEL
ALT: 22 IU/L (ref 0–44)
AST: 22 IU/L (ref 0–40)
Albumin/Globulin Ratio: 1.8 (ref 1.2–2.2)
Albumin: 4.7 g/dL (ref 4.0–5.0)
Alkaline Phosphatase: 64 IU/L (ref 48–121)
BUN/Creatinine Ratio: 13 (ref 9–20)
BUN: 14 mg/dL (ref 6–24)
Bilirubin Total: 0.9 mg/dL (ref 0.0–1.2)
CO2: 27 mmol/L (ref 20–29)
Calcium: 9.9 mg/dL (ref 8.7–10.2)
Chloride: 93 mmol/L — ABNORMAL LOW (ref 96–106)
Creatinine, Ser: 1.09 mg/dL (ref 0.76–1.27)
GFR calc Af Amer: 98 mL/min/{1.73_m2} (ref >59)
GFR calc non Af Amer: 84 mL/min/{1.73_m2} (ref >59)
Globulin, Total: 2.6 g/dL (ref 1.5–4.5)
Glucose: 91 mg/dL (ref 65–99)
Potassium: 4.4 mmol/L (ref 3.5–5.2)
Sodium: 134 mmol/L (ref 134–144)
Total Protein: 7.3 g/dL (ref 6.0–8.5)

## 2019-12-15 LAB — CBC WITH DIFFERENTIAL/PLATELET
Basophils Absolute: 0 10*3/uL (ref 0.0–0.2)
Basos: 1 %
EOS (ABSOLUTE): 0.1 10*3/uL (ref 0.0–0.4)
Eos: 2 %
Hematocrit: 47 % (ref 37.5–51.0)
Hemoglobin: 16 g/dL (ref 13.0–17.7)
Immature Grans (Abs): 0 10*3/uL (ref 0.0–0.1)
Immature Granulocytes: 1 %
Lymphocytes Absolute: 1.6 10*3/uL (ref 0.7–3.1)
Lymphs: 31 %
MCH: 31.3 pg (ref 26.6–33.0)
MCHC: 34 g/dL (ref 31.5–35.7)
MCV: 92 fL (ref 79–97)
Monocytes Absolute: 0.9 10*3/uL (ref 0.1–0.9)
Monocytes: 17 %
Neutrophils Absolute: 2.6 10*3/uL (ref 1.4–7.0)
Neutrophils: 48 %
Platelets: 265 10*3/uL (ref 150–450)
RBC: 5.11 x10E6/uL (ref 4.14–5.80)
RDW: 12.5 % (ref 11.6–15.4)
WBC: 5.2 10*3/uL (ref 3.4–10.8)

## 2019-12-15 MED ORDER — DIPHENOXYLATE-ATROPINE 2.5-0.025 MG PO TABS: 1.0000 | ORAL_TABLET | Freq: Four times a day (QID) | ORAL | 0 refills | Status: DC | PRN

## 2019-12-15 NOTE — Progress Notes (Signed)
Subjective:  Patient ID: Edward Terry, male    DOB: 05/24/1981  Age: 39 y.o. MRN: [id number]  Chief Complaint  Patient presents with  . Diarrhea    4-5 Days    HPI: diarrhea for 5 days.  Ate out a [employer] on Friday and next day severe diarrhea.  No fever or chills.  It is improving. Some hemorrhoidal pain.  BM 10 a day.   Current Outpatient Medications on File Prior to Visit  Medication Sig Dispense Refill  . atorvastatin (LIPITOR) 10 MG tablet Take 1 tablet (10 mg total) by mouth daily. 90 tablet 2  . clonazePAM (KLONOPIN) 0.5 MG tablet Take 1 tablet (0.5 mg total) by mouth daily as needed for anxiety. 30 tablet 2  . pantoprazole (PROTONIX) 40 MG tablet TAKE 1 TABLET BY MOUTH DAILY. 30-60 MINS BEFORE BREAKFAST 90 tablet 3  . chlorthalidone (HYGROTON) 25 MG tablet Take 1 tablet (25 mg total) by mouth daily. 30 tablet 11  . lisinopril (ZESTRIL) 10 MG tablet Take 1 tablet (10 mg total) by mouth daily. 30 tablet 11   No current facility-administered medications on file prior to visit.   Past Medical History:  Diagnosis Date  . Essential hypertension   . Generalized anxiety disorder   . GERD without esophagitis   . Hyperlipidemia 01/23/2016  . Hypertension 2013  . Mixed hyperlipidemia    Past Surgical History:  Procedure Laterality Date  . None      Family History  Problem Relation Age of Onset  . Breast cancer Mother   . Healthy Father   . Heart attack Maternal Grandfather   . Dementia Paternal Grandmother   . Other Paternal Grandfather        suicide  . Pancreatic disease Other        great aunt  . Colon cancer Neg Hx   . Rectal cancer Neg Hx   . Esophageal cancer Neg Hx   . Liver cancer Neg Hx    Social History   Socioeconomic History  . Marital status: Married    Spouse name: Not on file  . Number of children: 2  . Years of education: Not on file  . Highest education level: Not on file  Occupational History  . Occupation: Sales  Tobacco Use  .  Smoking status: Former Research scientist (life sciences)  . Smokeless tobacco: Former Systems developer    Types: Snuff  . Tobacco comment: during college  Vaping Use  . Vaping Use: Never used  Substance and Sexual Activity  . Alcohol use: Yes    Alcohol/week: 4.0 standard drinks    Types: 4 Cans of beer per week    Comment: 4-5 cans daily  . Drug use: No  . Sexual activity: Not on file  Other Topics Concern  . Not on file  Social History Narrative  . Not on file   Social Determinants of Health   Financial Resource Strain:   . Difficulty of Paying Living Expenses:   Food Insecurity:   . Worried About Charity fundraiser in the Last Year:   . Arboriculturist in the Last Year:   Transportation Needs:   . Film/video editor (Medical):   Marland Kitchen Lack of Transportation (Non-Medical):   Physical Activity:   . Days of Exercise per Week:   . Minutes of Exercise per Session:   Stress:   . Feeling of Stress :   Social Connections:   . Frequency of Communication with Friends and Family:   .  Frequency of Social Gatherings with Friends and Family:   . Attends Religious Services:   . Active Member of Clubs or Organizations:   . Attends Banker Meetings:   Marland Kitchen Marital Status:     Review of Systems  Constitutional: Negative.   HENT: Negative.   Eyes: Negative.   Respiratory: Negative.   Cardiovascular: Negative.   Gastrointestinal: Positive for diarrhea.  Genitourinary: Negative.   Musculoskeletal: Negative.   Neurological: Negative.   Psychiatric/Behavioral: Negative.      Objective:  BP 122/80   Pulse 67   Temp (!) 97.3 F (36.3 C)   Resp 16   Ht 6\' 5"  (1.956 m)   Wt 214 lb 6.4 oz (97.3 kg)   SpO2 97%   BMI 25.42 kg/m   BP/Weight 12/15/2019 01/08/2019 03/12/2018  Systolic BP 122 150 134  Diastolic BP 80 84 90  Wt. (Lbs) 214.4 228 234.5  BMI 25.42 27.04 28.85    Physical Exam Vitals reviewed.  Constitutional:      Appearance: Normal appearance.  HENT:     Head: Normocephalic and  atraumatic.     Nose: Nose normal.     Mouth/Throat:     Mouth: Mucous membranes are dry.  Cardiovascular:     Rate and Rhythm: Normal rate and regular rhythm.     Pulses: Normal pulses.     Heart sounds: Normal heart sounds.  Pulmonary:     Effort: Pulmonary effort is normal.     Breath sounds: Normal breath sounds.  Abdominal:     General: Abdomen is flat. Bowel sounds are normal.     Palpations: Abdomen is soft.  Musculoskeletal:     Cervical back: Normal range of motion.  Neurological:     General: No focal deficit present.     Mental Status: He is alert and oriented to person, place, and time.       Lab Results  Component Value Date   WBC 5.8 09/01/2019   HGB 15.2 09/01/2019   HCT 44.7 09/01/2019   PLT 277 09/01/2019   GLUCOSE 98 09/01/2019   CHOL 197 09/01/2019   TRIG 107 09/01/2019   HDL 60 09/01/2019   LDLCALC 118 (H) 09/01/2019   ALT 27 09/01/2019   AST 25 09/01/2019   NA 136 09/01/2019   K 5.0 09/01/2019   CL 92 (L) 09/01/2019   CREATININE 0.98 09/01/2019   BUN 11 09/01/2019   CO2 29 09/01/2019      Assessment & Plan:   1. GERD without esophagitis Plan of care was formulated today.  She is doing well.  A plan of care was formulated using patient exam, tests and other sources to optimize care using evidence based information.  Recommend no smoking, no eating after supper, avoid fatty foods, elevate Head of bed, avoid tight fitting clothing.  Continue on omprazole.  2. Gastroenteritis and colitis, toxic - CBC with Differential/Platelet - Comprehensive metabolic panel - diphenoxylate-atropine (LOMOTIL) 2.5-0.025 MG tablet; Take 1 tablet by mouth 4 (four) times daily as needed for diarrhea or loose stools.  Dispense: 30 tablet; Refill: 0 Patient has gastroenteritis secondary to food contamination, He is already improving   Meds ordered this encounter  Medications  . diphenoxylate-atropine (LOMOTIL) 2.5-0.025 MG tablet    Sig: Take 1 tablet by mouth 4  (four) times daily as needed for diarrhea or loose stools.    Dispense:  30 tablet    Refill:  0    Orders Placed This Encounter  Procedures  . CBC with Differential/Platelet  . Comprehensive metabolic panel     Follow-up: No follow-ups on file.  An After Visit Summary was printed and given to the patient.  Brent Bulla [facility] [phone]

## 2019-12-16 NOTE — Progress Notes (Signed)
CBC normal, kidney and liver tests normal,  lp

## 2019-12-28 ENCOUNTER — Other Ambulatory Visit: Payer: Self-pay | Admitting: Cardiology

## 2020-01-24 ENCOUNTER — Other Ambulatory Visit: Payer: Self-pay | Admitting: Cardiology

## 2020-02-03 ENCOUNTER — Other Ambulatory Visit: Payer: Self-pay | Admitting: Cardiology

## 2020-02-07 ENCOUNTER — Other Ambulatory Visit: Payer: Self-pay | Admitting: Cardiology

## 2020-02-18 ENCOUNTER — Other Ambulatory Visit: Payer: Self-pay | Admitting: Cardiology

## 2020-02-23 ENCOUNTER — Telehealth: Payer: Self-pay | Admitting: Gastroenterology

## 2020-02-23 ENCOUNTER — Other Ambulatory Visit: Payer: Self-pay | Admitting: Gastroenterology

## 2020-02-23 NOTE — Telephone Encounter (Signed)
Pt is requesting a refill on his PROTONIX.

## 2020-02-24 MED ORDER — PANTOPRAZOLE SODIUM 40 MG PO TBEC
40.0000 mg | DELAYED_RELEASE_TABLET | Freq: Every day | ORAL | 0 refills | Status: DC
Start: 2020-02-24 — End: 2020-03-20

## 2020-02-24 NOTE — Telephone Encounter (Signed)
Sent script for 30 day supply only. Patient needs an office visit.

## 2020-03-05 ENCOUNTER — Other Ambulatory Visit: Payer: Self-pay | Admitting: Cardiology

## 2020-03-15 ENCOUNTER — Other Ambulatory Visit: Payer: Self-pay | Admitting: Cardiology

## 2020-03-15 NOTE — Telephone Encounter (Signed)
Pt is overdue for 12 month f/u. Please contact pt for future appointment.

## 2020-03-20 ENCOUNTER — Other Ambulatory Visit: Payer: Self-pay | Admitting: Physician Assistant

## 2020-03-23 ENCOUNTER — Other Ambulatory Visit: Payer: Self-pay

## 2020-03-23 ENCOUNTER — Ambulatory Visit (INDEPENDENT_AMBULATORY_CARE_PROVIDER_SITE_OTHER): Payer: BLUE CROSS/BLUE SHIELD | Admitting: Cardiology

## 2020-03-23 ENCOUNTER — Encounter: Payer: Self-pay | Admitting: Cardiology

## 2020-03-23 VITALS — BP 125/60 | HR 63 | Temp 97.2°F | Ht 76.0 in | Wt 221.2 lb

## 2020-03-23 DIAGNOSIS — Z7189 Other specified counseling: Secondary | ICD-10-CM | POA: Diagnosis not present

## 2020-03-23 DIAGNOSIS — I10 Essential (primary) hypertension: Secondary | ICD-10-CM | POA: Diagnosis not present

## 2020-03-23 DIAGNOSIS — E7800 Pure hypercholesterolemia, unspecified: Secondary | ICD-10-CM | POA: Diagnosis not present

## 2020-03-23 DIAGNOSIS — Z8249 Family history of ischemic heart disease and other diseases of the circulatory system: Secondary | ICD-10-CM

## 2020-03-23 NOTE — Progress Notes (Signed)
Cardiology Office Note:    Date:  03/23/2020   ID:  Edward Terry, DOB 03/29/1981, MRN [id number]  PCP:  Abigail Miyamoto, MD  Cardiologist:  Jodelle Red, MD PhD  Referring MD: Abigail Miyamoto,*   CC: follow up  History of Present Illness:    Edward Terry is a 39 y.o. male with a hx of hypertension, hyperlipidemia  who is seen for follow up today. I initially met him as a new consult 01/08/19 at the request of Abigail Miyamoto,* for the evaluation and management of hypertension and hyperlipidemia.  Cardiovascular risk factors: Comorbid conditions: hypertension, hyperlipidemia. Tobacco use history: former smoker, quit >15 years ago Family history: Mat Gpa had 3V bypass, mother has irregular heart rhythm  Today: No new concerns. Feels like his overall health is at a good point, but there have been ups and downs. Doing [employer] with his sons, enjoying this. Blood pressure well controlled. On atorvastatin for hyperlipidemia.   Anxiety is well managed with PRN benzo (not frequent). Alcohol amount varies, either on or off, very little in between. Usually 3-4 beers/night of higher alcohol content beer when he is drinking his most, currently not drinking as he is working on his weight. Alcoholism runs in his family, is cognizant of this.   Denies chest pain, shortness of breath at rest or with normal exertion. No PND, orthopnea, LE edema or unexpected weight gain. No syncope or palpitations.  Past Medical History:  Diagnosis Date   Essential hypertension    Generalized anxiety disorder    GERD without esophagitis    Hyperlipidemia 01/23/2016   Hypertension 2013   Mixed hyperlipidemia     Past Surgical History:  Procedure Laterality Date   None      Current Medications: Current Outpatient Medications on File Prior to Visit  Medication Sig   atorvastatin (LIPITOR) 10 MG tablet Take 1 tablet (10 mg total) by mouth daily.    chlorthalidone (HYGROTON) 25 MG tablet TAKE 1 TABLET BY MOUTH EVERY DAY   clonazePAM (KLONOPIN) 0.5 MG tablet Take 1 tablet (0.5 mg total) by mouth daily as needed for anxiety.   lisinopril (ZESTRIL) 10 MG tablet TAKE 1 TABLET BY MOUTH EVERY DAY   pantoprazole (PROTONIX) 40 MG tablet TAKE 1 TABLET (40 MG TOTAL) BY MOUTH DAILY. NEEDS OFFICE VISIT.   No current facility-administered medications on file prior to visit.     Allergies:   Solarcaine aloe extra [lidocaine]   Social History   Tobacco Use   Smoking status: Former Smoker   Smokeless tobacco: Former Neurosurgeon    Types: Snuff   Tobacco comment: during Therapist, art Use: Never used  Substance Use Topics   Alcohol use: Yes    Alcohol/week: 4.0 standard drinks    Types: 4 Cans of beer per week    Comment: 4-5 cans daily   Drug use: No    Family History: The patient's family history includes Breast cancer in his mother; Dementia in his paternal grandmother; Healthy in his father; Heart attack in his maternal grandfather; Other in his paternal grandfather; Pancreatic disease in an other family member. There is no history of Colon cancer, Rectal cancer, Esophageal cancer, or Liver cancer.  ROS:   Please see the history of present illness.  Additional pertinent ROS otherwise unremarkable.  EKGs/Labs/Other Studies Reviewed:    The following studies were reviewed today: Notes from PCP office 11/20/2018  EKG:  EKG is personally reviewed.  The ekg ordered today demonstrates NSR with sinus arrhythmia at 63 bpm.  Recent Labs: 12/15/2019: ALT 22; BUN 14; Creatinine, Ser 1.09; Hemoglobin 16.0; Platelets 265; Potassium 4.4; Sodium 134  Recent Lipid Panel    Component Value Date/Time   CHOL 197 09/01/2019 0744   TRIG 107 09/01/2019 0744   HDL 60 09/01/2019 0744   LDLCALC 118 (H) 09/01/2019 0744    Physical Exam:    VS:  BP 125/60    Pulse 63    Temp (!) 97.2 F (36.2 C)    Ht $R'6\' 4"'JI$  (1.93 m)    Wt 221 lb 3.4  oz (100.3 kg)    SpO2 97%    BMI 26.93 kg/m     Wt Readings from Last 3 Encounters:  03/23/20 221 lb 3.4 oz (100.3 kg)  12/15/19 214 lb 6.4 oz (97.3 kg)  01/08/19 228 lb (103.4 kg)    GEN: Well nourished, well developed in no acute distress HEENT: Normal, moist mucous membranes NECK: No JVD CARDIAC: regular rhythm, normal S1 and S2, no rubs or gallops. No murmur. VASCULAR: Radial and DP pulses 2+ bilaterally. No carotid bruits RESPIRATORY:  Clear to auscultation without rales, wheezing or rhonchi  ABDOMEN: Soft, non-tender, non-distended MUSCULOSKELETAL:  Ambulates independently SKIN: Warm and dry, no edema NEUROLOGIC:  Alert and oriented x 3. No focal neuro deficits noted. PSYCHIATRIC:  Normal affect   ASSESSMENT:    1. Essential hypertension   2. Pure hypercholesterolemia   3. Family history of heart disease   4. Cardiac risk counseling   5. Counseling on health promotion and disease prevention    PLAN:    Cardiac risk counseling and prevention recommendations: -recommend heart healthy/Mediterranean diet, with whole grains, fruits, vegetable, fish, lean meats, nuts, and olive oil. Limit salt. -recommend moderate walking, 3-5 times/week for 30-50 minutes each session. Aim for at least 150 minutes.week. Goal should be pace of 3 miles/hours, or walking 1.5 miles in 30 minutes -recommend avoidance of tobacco products.  -Avoid excess alcohol. We emphasized this again today -The 10-year ASCVD risk score Mikey Bussing DC Brooke Bonito., et al., 2013) is: 1.1%   Values used to calculate the score:     Age: 69 years     Sex: Male     Is Non-Hispanic African American: No     Diabetic: No     Tobacco smoker: No     Systolic Blood Pressure: 130 mmHg     Is BP treated: Yes     HDL Cholesterol: 60 mg/dL     Total Cholesterol: 197 mg/dL  -with family history, his ASCVD risk may be higher than suggested by the calculator  Varicose veins/vascular concerns: superficial varicose veins.    Hypercholesterolemia: reviewed today. Tolerating atorvastatin 10 mg daily. LDL 118.   Hypertension: at goal today, though he feels that this is low for him -for now, continue lisinopril and chlorthalidone -discussed that with good lifestyle and decreased alcohol, his BP may drop -would stop chlorthalidone first, use lisinopril alone if his BP improves -goal <130/80  Plan for follow up: I would be happy to see him as needed  Medication Adjustments/Labs and Tests Ordered: Current medicines are reviewed at length with the patient today.  Concerns regarding medicines are outlined above.  No orders of the defined types were placed in this encounter.  No orders of the defined types were placed in this encounter.   Patient Instructions  Medication Instructions:  Continue same medications   Lab Work: None ordered  Testing/Procedures: None ordered   Follow-Up: At [facility], you and your health needs are our priority.  As part of our continuing mission to provide you with exceptional heart care, we have created designated Provider Care Teams.  These Care Teams include your primary Cardiologist (physician) and Advanced Practice Providers (APPs -  Physician Assistants and Nurse Practitioners) who all work together to provide you with the care you need, when you need it.  We recommend signing up for the patient portal called "MyChart".  Sign up information is provided on this After Visit Summary.  MyChart is used to connect with patients for Virtual Visits (Telemedicine).  Patients are able to view lab/test results, encounter notes, upcoming appointments, etc.  Non-urgent messages can be sent to your provider as well.   To learn more about what you can do with MyChart, go to [email or web address].    Your next appointment:  As Needed    Provider:  Dr.Harli Engelken    Signed, Buford Dresser, MD PhD 03/23/2020 5:22 PM    [place]

## 2020-03-23 NOTE — Patient Instructions (Signed)
Medication Instructions:  Continue same medications   Lab Work: None ordered   Testing/Procedures: None ordered   Follow-Up: At [facility], you and your health needs are our priority.  As part of our continuing mission to provide you with exceptional heart care, we have created designated Provider Care Teams.  These Care Teams include your primary Cardiologist (physician) and Advanced Practice Providers (APPs -  Physician Assistants and Nurse Practitioners) who all work together to provide you with the care you need, when you need it.  We recommend signing up for the patient portal called "MyChart".  Sign up information is provided on this After Visit Summary.  MyChart is used to connect with patients for Virtual Visits (Telemedicine).  Patients are able to view lab/test results, encounter notes, upcoming appointments, etc.  Non-urgent messages can be sent to your provider as well.   To learn more about what you can do with MyChart, go to [email or web address].    Your next appointment:  As Needed    Provider:  Dr.Christopher

## 2020-04-12 ENCOUNTER — Other Ambulatory Visit: Payer: Self-pay | Admitting: Cardiology

## 2020-04-14 ENCOUNTER — Other Ambulatory Visit: Payer: Self-pay | Admitting: Legal Medicine

## 2020-04-14 DIAGNOSIS — F411 Generalized anxiety disorder: Secondary | ICD-10-CM

## 2020-05-08 ENCOUNTER — Ambulatory Visit (INDEPENDENT_AMBULATORY_CARE_PROVIDER_SITE_OTHER): Payer: BLUE CROSS/BLUE SHIELD | Admitting: Gastroenterology

## 2020-05-08 ENCOUNTER — Encounter: Payer: Self-pay | Admitting: Gastroenterology

## 2020-05-08 VITALS — BP 122/70 | HR 72 | Ht 76.0 in | Wt 226.0 lb

## 2020-05-08 DIAGNOSIS — R12 Heartburn: Secondary | ICD-10-CM | POA: Diagnosis not present

## 2020-05-08 DIAGNOSIS — J029 Acute pharyngitis, unspecified: Secondary | ICD-10-CM

## 2020-05-08 DIAGNOSIS — K219 Gastro-esophageal reflux disease without esophagitis: Secondary | ICD-10-CM | POA: Diagnosis not present

## 2020-05-08 NOTE — Patient Instructions (Addendum)
You have been scheduled for an endoscopy. Please follow written instructions given to you at your visit today. If you use inhalers (even only as needed), please bring them with you on the day of your procedure.   If you are age 41 or older, your body mass index should be between 23-30. Your Body mass index is 27.51 kg/m. If this is out of the aforementioned range listed, please consider follow up with your Primary Care Provider.  If you are age 32 or younger, your body mass index should be between 19-25. Your Body mass index is 27.51 kg/m. If this is out of the aformentioned range listed, please consider follow up with your Primary Care Provider.    Due to recent changes in healthcare laws, you may see the results of your imaging and laboratory studies on MyChart before your provider has had a chance to review them.  We understand that in some cases there may be results that are confusing or concerning to you. Not all laboratory results come back in the same time frame and the provider may be waiting for multiple results in order to interpret others.  Please give [place] 48 hours in order for your provider to thoroughly review all the results before contacting the office for clarification of your results.   We will refill your Protonix today    Gastroesophageal Reflux Disease, Adult Gastroesophageal reflux (GER) happens when acid from the stomach flows up into the tube that connects the mouth and the stomach (esophagus). Normally, food travels down the esophagus and stays in the stomach to be digested. However, when a person has GER, food and stomach acid sometimes move back up into the esophagus. If this becomes a more serious problem, the person may be diagnosed with a disease called gastroesophageal reflux disease (GERD). GERD occurs when the reflux:  Happens often.  Causes frequent or severe symptoms.  Causes problems such as damage to the esophagus. When stomach acid comes in contact with  the esophagus, the acid may cause soreness (inflammation) in the esophagus. Over time, GERD may create small holes (ulcers) in the lining of the esophagus. What are the causes? This condition is caused by a problem with the muscle between the esophagus and the stomach (lower esophageal sphincter, or LES). Normally, the LES muscle closes after food passes through the esophagus to the stomach. When the LES is weakened or abnormal, it does not close properly, and that allows food and stomach acid to go back up into the esophagus. The LES can be weakened by certain dietary substances, medicines, and medical conditions, including:  Tobacco use.  Pregnancy.  Having a hiatal hernia.  Alcohol use.  Certain foods and beverages, such as coffee, chocolate, onions, and peppermint. What increases the risk? You are more likely to develop this condition if you:  Have an increased body weight.  Have a connective tissue disorder.  Use NSAID medicines. What are the signs or symptoms? Symptoms of this condition include:  Heartburn.  Difficult or painful swallowing.  The feeling of having a lump in the throat.  Abitter taste in the mouth.  Bad breath.  Having a large amount of saliva.  Having an upset or bloated stomach.  Belching.  Chest pain. Different conditions can cause chest pain. Make sure you see your health care provider if you experience chest pain.  Shortness of breath or wheezing.  Ongoing (chronic) cough or a night-time cough.  Wearing away of tooth enamel.  Weight loss. How is  this diagnosed? Your health care provider will take a medical history and perform a physical exam. To determine if you have mild or severe GERD, your health care provider may also monitor how you respond to treatment. You may also have tests, including:  A test to examine your stomach and esophagus with a small camera (endoscopy).  A test thatmeasures the acidity level in your esophagus.  A  test thatmeasures how much pressure is on your esophagus.  A barium swallow or modified barium swallow test to show the shape, size, and functioning of your esophagus. How is this treated? The goal of treatment is to help relieve your symptoms and to prevent complications. Treatment for this condition may vary depending on how severe your symptoms are. Your health care provider may recommend:  Changes to your diet.  Medicine.  Surgery. Follow these instructions at home: Eating and drinking   Follow a diet as recommended by your health care provider. This may involve avoiding foods and drinks such as: ? Coffee and tea (with or without caffeine). ? Drinks that containalcohol. ? Energy drinks and sports drinks. ? Carbonated drinks or sodas. ? Chocolate and cocoa. ? Peppermint and mint flavorings. ? Garlic and onions. ? Horseradish. ? Spicy and acidic foods, including peppers, chili powder, curry powder, vinegar, hot sauces, and barbecue sauce. ? Citrus fruit juices and citrus fruits, such as oranges, lemons, and limes. ? Tomato-based foods, such as red sauce, chili, salsa, and pizza with red sauce. ? Fried and fatty foods, such as donuts, french fries, potato chips, and high-fat dressings. ? High-fat meats, such as hot dogs and fatty cuts of red and white meats, such as rib eye steak, sausage, ham, and bacon. ? High-fat dairy items, such as whole milk, butter, and cream cheese.  Eat small, frequent meals instead of large meals.  Avoid drinking large amounts of liquid with your meals.  Avoid eating meals during the 2-3 hours before bedtime.  Avoid lying down right after you eat.  Do not exercise right after you eat. Lifestyle   Do not use any products that contain nicotine or tobacco, such as cigarettes, e-cigarettes, and chewing tobacco. If you need help quitting, ask your health care provider.  Try to reduce your stress by using methods such as yoga or meditation. If you  need help reducing stress, ask your health care provider.  If you are overweight, reduce your weight to an amount that is healthy for you. Ask your health care provider for guidance about a safe weight loss goal. General instructions  Pay attention to any changes in your symptoms.  Take over-the-counter and prescription medicines only as told by your health care provider. Do not take aspirin, ibuprofen, or other NSAIDs unless your health care provider told you to do so.  Wear loose-fitting clothing. Do not wear anything tight around your waist that causes pressure on your abdomen.  Raise (elevate) the head of your bed about 6 inches (15 cm).  Avoid bending over if this makes your symptoms worse.  Keep all follow-up visits as told by your health care provider. This is important. Contact a health care provider if:  You have: ? New symptoms. ? Unexplained weight loss. ? Difficulty swallowing or it hurts to swallow. ? Wheezing or a persistent cough. ? A hoarse voice.  Your symptoms do not improve with treatment. Get help right away if you:  Have pain in your arms, neck, jaw, teeth, or back.  Feel sweaty, dizzy, or light-headed.  Have chest pain or shortness of breath.  Vomit and your vomit looks like blood or coffee grounds.  Faint.  Have stool that is bloody or black.  Cannot swallow, drink, or eat. Summary  Gastroesophageal reflux happens when acid from the stomach flows up into the esophagus. GERD is a disease in which the reflux happens often, causes frequent or severe symptoms, or causes problems such as damage to the esophagus.  Treatment for this condition may vary depending on how severe your symptoms are. Your health care provider may recommend diet and lifestyle changes, medicine, or surgery.  Contact a health care provider if you have new or worsening symptoms.  Take over-the-counter and prescription medicines only as told by your health care provider. Do not  take aspirin, ibuprofen, or other NSAIDs unless your health care provider told you to do so.  Keep all follow-up visits as told by your health care provider. This is important. This information is not intended to replace advice given to you by your health care provider. Make sure you discuss any questions you have with your health care provider. Document Revised: 12/17/2017 Document Reviewed: 12/17/2017 Elsevier Patient Education  2020 [employer].   I appreciate the  opportunity to care for you  Thank You   Marsa Aris , MD

## 2020-05-08 NOTE — Progress Notes (Signed)
Edward Terry    [id number]    11/09/1980  Primary Care Physician:Perry, Mickle Mallory, MD  Referring Physician: Abigail Miyamoto, MD [address] [place],  [place] [address]   Chief complaint:  GERD  HPI: 39 year old very pleasant gentleman here for follow-up visit for heartburn, sore throat and GERD symptoms He started having heartburn, hoarseness, sore throat and reflux symptoms in the past 2 to 3 years.  Symptoms somewhat better when he takes pantoprazole 40 mg daily but do not completely go away.  When he stops taking pantoprazole he feels the sore throat and heartburn gets worse, he forgot to take them few weeks ago and he still noticing significant heartburn as well as epigastric retrosternal discomfort. Denies any dysphagia or odynophagia.  No vomiting.  Denies any frequent NSAID use. He does notice worsening symptoms when he drinks more coffee or after excessive alcohol intake.  On average he has been drinking 3-4 beer per day.  He is having increased stress at work.  No melena or blood per rectum. No family history of GI malignancy.   Outpatient Encounter Medications as of 05/08/2020  Medication Sig  . atorvastatin (LIPITOR) 10 MG tablet Take 1 tablet (10 mg total) by mouth daily.  . chlorthalidone (HYGROTON) 25 MG tablet TAKE 1 TABLET BY MOUTH EVERY DAY  . clonazePAM (KLONOPIN) 0.5 MG tablet TAKE 1 TABLET BY MOUTH EVERY DAY AS NEEDED FOR ANXIETY  . lisinopril (ZESTRIL) 10 MG tablet TAKE 1 TABLET BY MOUTH EVERY DAY  . pantoprazole (PROTONIX) 40 MG tablet TAKE 1 TABLET (40 MG TOTAL) BY MOUTH DAILY. NEEDS OFFICE VISIT.   No facility-administered encounter medications on file as of 05/08/2020.    Allergies as of 05/08/2020 - Review Complete 05/08/2020  Allergen Reaction Noted  . Solarcaine aloe extra [lidocaine] Other (See Comments) 01/28/2017    Past Medical History:  Diagnosis Date  . Essential hypertension   . Generalized  anxiety disorder   . GERD without esophagitis   . Hyperlipidemia 01/23/2016  . Hypertension 2013  . Mixed hyperlipidemia     Past Surgical History:  Procedure Laterality Date  . None      Family History  Problem Relation Age of Onset  . Breast cancer Mother   . Healthy Father   . Heart attack Maternal Grandfather   . Dementia Paternal Grandmother   . Other Paternal Grandfather        suicide  . Pancreatic disease Other        great aunt  . Colon cancer Neg Hx   . Rectal cancer Neg Hx   . Esophageal cancer Neg Hx   . Liver cancer Neg Hx     Social History   Socioeconomic History  . Marital status: Married    Spouse name: Not on file  . Number of children: 2  . Years of education: Not on file  . Highest education level: Not on file  Occupational History  . Occupation: Sales  Tobacco Use  . Smoking status: Former Games developer  . Smokeless tobacco: Former Neurosurgeon    Types: Snuff  . Tobacco comment: during college  Vaping Use  . Vaping Use: Never used  Substance and Sexual Activity  . Alcohol use: Yes    Alcohol/week: 4.0 standard drinks    Types: 4 Cans of beer per week    Comment: 4-5 cans daily  . Drug use: No  . Sexual activity: Not  on file  Other Topics Concern  . Not on file  Social History Narrative  . Not on file   Social Determinants of Health   Financial Resource Strain:   . Difficulty of Paying Living Expenses: Not on file  Food Insecurity:   . Worried About Programme researcher, broadcasting/film/video in the Last Year: Not on file  . Ran Out of Food in the Last Year: Not on file  Transportation Needs:   . Lack of Transportation (Medical): Not on file  . Lack of Transportation (Non-Medical): Not on file  Physical Activity:   . Days of Exercise per Week: Not on file  . Minutes of Exercise per Session: Not on file  Stress:   . Feeling of Stress : Not on file  Social Connections:   . Frequency of Communication with Friends and Family: Not on file  . Frequency of Social  Gatherings with Friends and Family: Not on file  . Attends Religious Services: Not on file  . Active Member of Clubs or Organizations: Not on file  . Attends Banker Meetings: Not on file  . Marital Status: Not on file  Intimate Partner Violence:   . Fear of Current or Ex-Partner: Not on file  . Emotionally Abused: Not on file  . Physically Abused: Not on file  . Sexually Abused: Not on file      Review of systems: All other review of systems negative except as mentioned in the HPI.   Physical Exam: Vitals:   05/08/20 1112  BP: 122/70  Pulse: 72  SpO2: 99%   Body mass index is 27.51 kg/m. Gen:      No acute distress Psych: normal mood and affect  Data Reviewed:  Reviewed labs, radiology imaging, old records and pertinent past GI work up   Assessment and Plan/Recommendations:  39 year old very pleasant gentleman here for follow-up for heartburn and reflux related symptoms  GERD symptoms, persistent and recurring despite daily PPI use We will proceed with EGD to exclude erosive esophagitis, Barrett's esophagus or neoplastic lesion. Continue pantoprazole 40 mg daily and antireflux measures Advised patient to limit to less than 2 drinks of alcohol per day Continue to avoid NSAIDs  The risks and benefits as well as alternatives of endoscopic procedure(s) have been discussed and reviewed. All questions answered. The patient agrees to proceed.  Return after EGD as needed  This visit required 40 minutes of patient care (this includes precharting, chart review, review of results, face-to-face time used for counseling as well as treatment plan and follow-up. The patient was provided an opportunity to ask questions and all were answered. The patient agreed with the plan and demonstrated an understanding of the instructions.  Iona Beard , MD    CC: Abigail Miyamoto

## 2020-05-22 ENCOUNTER — Other Ambulatory Visit: Payer: Self-pay | Admitting: Legal Medicine

## 2020-05-22 DIAGNOSIS — E782 Mixed hyperlipidemia: Secondary | ICD-10-CM

## 2020-05-23 ENCOUNTER — Other Ambulatory Visit: Payer: Self-pay

## 2020-05-23 ENCOUNTER — Encounter: Payer: Self-pay | Admitting: Gastroenterology

## 2020-05-23 ENCOUNTER — Ambulatory Visit (AMBULATORY_SURGERY_CENTER): Payer: BLUE CROSS/BLUE SHIELD | Admitting: Gastroenterology

## 2020-05-23 VITALS — BP 121/83 | HR 59 | Temp 98.3°F | Resp 16 | Ht 76.0 in | Wt 226.0 lb

## 2020-05-23 DIAGNOSIS — K219 Gastro-esophageal reflux disease without esophagitis: Secondary | ICD-10-CM | POA: Diagnosis not present

## 2020-05-23 DIAGNOSIS — K2100 Gastro-esophageal reflux disease with esophagitis, without bleeding: Secondary | ICD-10-CM | POA: Diagnosis not present

## 2020-05-23 MED ORDER — FAMOTIDINE 20 MG PO TABS: 20.0000 mg | ORAL_TABLET | Freq: Every day | ORAL | 3 refills | Status: DC

## 2020-05-23 MED ORDER — OMEPRAZOLE 40 MG PO CPDR: 40.0000 mg | DELAYED_RELEASE_CAPSULE | Freq: Every day | ORAL | 3 refills | Status: DC

## 2020-05-23 MED ORDER — SODIUM CHLORIDE 0.9 % IV SOLN: 500.0000 mL | Freq: Once | INTRAVENOUS | Status: DC

## 2020-05-23 NOTE — Progress Notes (Signed)
pt tolerated well. VSS. awake and to recovery. Report given to RN. Bite block inserted and removed without trauma. 

## 2020-05-23 NOTE — Op Note (Signed)
[facility] Patient Name: Edward Terry Procedure Date: 05/23/2020 8:23 AM MRN: [id number] Endoscopist: Napoleon Form , MD Age: 39 Referring MD:  Date of Birth: March 15, 1981 Gender: Male Account #: [id number] Procedure:                Upper GI endoscopy Indications:              Dyspepsia, Heartburn, Esophageal reflux symptoms                            that persist despite appropriate therapy Medicines:                Monitored Anesthesia Care Procedure:                Pre-Anesthesia Assessment:                           - Prior to the procedure, a History and Physical                            was performed, and patient medications and                            allergies were reviewed. The patient's tolerance of                            previous anesthesia was also reviewed. The risks                            and benefits of the procedure and the sedation                            options and risks were discussed with the patient.                            All questions were answered, and informed consent                            was obtained. Prior Anticoagulants: The patient has                            taken no previous anticoagulant or antiplatelet                            agents. ASA Grade Assessment: II - A patient with                            mild systemic disease. After reviewing the risks                            and benefits, the patient was deemed in                            satisfactory condition to undergo the procedure.  After obtaining informed consent, the endoscope was                            passed under direct vision. Throughout the                            procedure, the patient's blood pressure, pulse, and                            oxygen saturations were monitored continuously. The                            Endoscope was introduced through the mouth, and                            advanced  to the second part of duodenum. The upper                            GI endoscopy was accomplished without difficulty.                            The patient tolerated the procedure well. Scope In: Scope Out: Findings:                 LA Grade B (one or more mucosal breaks greater than                            5 mm, not extending between the tops of two mucosal                            folds) esophagitis with no bleeding, and nodularity                            was found 36 to 38 cm from the incisors.                           There were esophageal mucosal changes suspicious                            for short-segment Barrett's esophagus present at                            the gastroesophageal junction. The maximum                            longitudinal extent of these mucosal changes was 1                            cm in length. Mucosa was biopsied with a cold                            forceps for histology in a targeted manner 0.5 and  1 cm proximal to the GE junction. One specimen                            bottle was sent to pathology.                           The gastroesophageal flap valve was visualized                            endoscopically and classified as Hill Grade III                            (minimal fold, loose to endoscope, hiatal hernia                            likely).                           Patchy mildly erythematous mucosa without bleeding                            was found in the gastric body, in the gastric                            antrum and in the prepyloric region of the stomach.                            Biopsies were taken with a cold forceps for                            Helicobacter pylori testing using CLOtest.                           The examined duodenum was normal. Complications:            No immediate complications. Estimated Blood Loss:     Estimated blood loss was minimal. Impression:                - LA Grade B reflux esophagitis with no bleeding                            and nodularity. Rule out Barrett's esophagus.                           - Esophageal mucosal changes suspicious for                            short-segment Barrett's esophagus. Biopsied.                           - Gastroesophageal flap valve classified as Hill                            Grade III (minimal fold, loose to endoscope, hiatal  hernia likely).                           - Erythematous mucosa in the gastric body, antrum                            and prepyloric region of the stomach. Biopsied.                           - Normal examined duodenum. Recommendation:           - Patient has a contact number available for                            emergencies. The signs and symptoms of potential                            delayed complications were discussed with the                            patient. Return to normal activities tomorrow.                            Written discharge instructions were provided to the                            patient.                           - Resume previous diet.                           - Continue present medications.                           - Await pathology results.                           - Follow an antireflux regimen.                           - Use Prilosec (omeprazole) 40 mg PO daily X 90                            tabs with 3 refills.                           - Use Pepcid (famotidine) 20 mg PO daily at bedtime                            PRN.                           - Return to GI office in 6 months. Napoleon Form, MD 05/23/2020 8:48:45 AM This report has been signed electronically.

## 2020-05-23 NOTE — Progress Notes (Signed)
VS-CW 

## 2020-05-23 NOTE — Progress Notes (Signed)
Called to room to assist during endoscopic procedure.  Patient ID and intended procedure confirmed with present staff. Received instructions for my participation in the procedure from the performing physician.  

## 2020-05-23 NOTE — Patient Instructions (Signed)
Thank you for letting us take care of your healthcare needs today Please see handout given to you on Antireflux precautions. Pick up new Rx for Prilosec at your pharmacy and Pepcid at your local grocery store.   YOU HAD AN ENDOSCOPIC PROCEDURE TODAY AT THE [facility]:   Refer to the procedure report that was given to you for any specific questions about what was found during the examination.  If the procedure report does not answer your questions, please call your gastroenterologist to clarify.  If you requested that your care partner not be given the details of your procedure findings, then the procedure report has been included in a sealed envelope for you to review at your convenience later.  YOU SHOULD EXPECT: Some feelings of bloating in the abdomen. Passage of more gas than usual.  Walking can help get rid of the air that was put into your GI tract during the procedure and reduce the bloating. If you had a lower endoscopy (such as a colonoscopy or flexible sigmoidoscopy) you may notice spotting of blood in your stool or on the toilet paper. If you underwent a bowel prep for your procedure, you may not have a normal bowel movement for a few days.  Please Note:  You might notice some irritation and congestion in your nose or some drainage.  This is from the oxygen used during your procedure.  There is no need for concern and it should clear up in a day or so.  SYMPTOMS TO REPORT IMMEDIATELY:    Following upper endoscopy (EGD)  Vomiting of blood or coffee ground material  New chest pain or pain under the shoulder blades  Painful or persistently difficult swallowing  New shortness of breath  Fever of 100F or higher  Black, tarry-looking stools  For urgent or emergent issues, a gastroenterologist can be reached at any hour by calling [phone]. Do not use MyChart messaging for urgent concerns.    DIET:  We do recommend a small meal at first, but then you may proceed to  your regular diet.  Drink plenty of fluids but you should avoid alcoholic beverages for 24 hours.  ACTIVITY:  You should plan to take it easy for the rest of today and you should NOT DRIVE or use heavy machinery until tomorrow (because of the sedation medicines used during the test).    FOLLOW UP: Our staff will call the number listed on your records 48-72 hours following your procedure to check on you and address any questions or concerns that you may have regarding the information given to you following your procedure. If we do not reach you, we will leave a message.  We will attempt to reach you two times.  During this call, we will ask if you have developed any symptoms of COVID 19. If you develop any symptoms (ie: fever, flu-like symptoms, shortness of breath, cough etc.) before then, please call [phone].  If you test positive for Covid 19 in the 2 weeks post procedure, please call and report this information to [place].    If any biopsies were taken you will be contacted by phone or by letter within the next 1-3 weeks.  Please call us at [phone] if you have not heard about the biopsies in 3 weeks.    SIGNATURES/CONFIDENTIALITY: You and/or your care partner have signed paperwork which will be entered into your electronic medical record.  These signatures attest to the fact that that the information above on  your After Visit Summary has been reviewed and is understood.  Full responsibility of the confidentiality of this discharge information lies with you and/or your care-partner. 

## 2020-05-24 LAB — HELICOBACTER PYLORI SCREEN-BIOPSY: UREASE: NEGATIVE

## 2020-05-24 NOTE — Progress Notes (Signed)
H pylori is negative lp

## 2020-05-25 ENCOUNTER — Telehealth: Payer: Self-pay

## 2020-05-25 NOTE — Telephone Encounter (Signed)
  Follow up Call-  Call back number 05/23/2020  Post procedure Call Back phone  # [phone]  Permission to leave phone message Yes  Some recent data might be hidden     Patient questions:  Do you have a fever, pain , or abdominal swelling? No. Pain Score  0 *  Have you tolerated food without any problems? Yes.    Have you been able to return to your normal activities? Yes.    Do you have any questions about your discharge instructions: Diet   No. Medications  No. Follow up visit  No.  Do you have questions or concerns about your Care? No.  Actions: * If pain score is 4 or above: 1. No action needed, pain <4.Have you developed a fever since your procedure? no  2.   Have you had an respiratory symptoms (SOB or cough) since your procedure? no  3.   Have you tested positive for COVID 19 since your procedure no  4.   Have you had any family members/close contacts diagnosed with the COVID 19 since your procedure?  no   If yes to any of these questions please route to Laverna Peace, RN and Karlton Lemon, RN

## 2020-06-05 ENCOUNTER — Encounter: Payer: Self-pay | Admitting: Gastroenterology

## 2020-06-21 ENCOUNTER — Encounter: Payer: Self-pay | Admitting: Legal Medicine

## 2020-06-21 ENCOUNTER — Other Ambulatory Visit: Payer: Self-pay

## 2020-06-21 ENCOUNTER — Ambulatory Visit (INDEPENDENT_AMBULATORY_CARE_PROVIDER_SITE_OTHER): Payer: BLUE CROSS/BLUE SHIELD | Admitting: Legal Medicine

## 2020-06-21 VITALS — BP 122/80 | HR 59 | Temp 97.3°F | Resp 16 | Ht 76.0 in | Wt 222.2 lb

## 2020-06-21 DIAGNOSIS — M545 Low back pain, unspecified: Secondary | ICD-10-CM

## 2020-06-21 DIAGNOSIS — I10 Essential (primary) hypertension: Secondary | ICD-10-CM

## 2020-06-21 DIAGNOSIS — G8929 Other chronic pain: Secondary | ICD-10-CM

## 2020-06-21 DIAGNOSIS — K219 Gastro-esophageal reflux disease without esophagitis: Secondary | ICD-10-CM | POA: Diagnosis not present

## 2020-06-21 DIAGNOSIS — Z0000 Encounter for general adult medical examination without abnormal findings: Secondary | ICD-10-CM | POA: Insufficient documentation

## 2020-06-21 NOTE — Progress Notes (Signed)
Subjective:  Patient ID: Edward Terry, male    DOB: [birth year]  Age: 39 y.o. MRN: [id number]  Chief Complaint  Patient presents with  . Annual Exam    HPI  Well Adult Physical: Patient here for a comprehensive physical exam.The patient reports problems - hypertension, hyperlipidemia Do you take any herbs or supplements that were not prescribed by a doctor? no Are you taking calcium supplements? no Are you taking aspirin daily? no  Encounter for general adult medical examination without abnormal findings  Physical ("At Risk" items are starred): Patient's last physical exam was 1 year ago .  Smoking: Life-long non-smoker ;  Physical Activity: Exercises at least 3 times per week ;  Alcohol/Drug Use: Is a non-drinker ; No illicit drug use ;  Patient is not afflicted from Stress Incontinence and Urge Incontinence  Safety: reviewed ; Patient wears a seat belt, has smoke detectors, has carbon monoxide detectors, practices appropriate gun safety, and wears sunscreen with extended sun exposure. Dental Care: biannual cleanings, brushes and flosses daily. Ophthalmology/Optometry: Annual visit.  Hearing loss: none Vision impairments: none Last WUJ:[id number]  Flowsheet Row Office Visit from 12/15/2019 in [facility]  PHQ-2 Total Score 0              Social History   Socioeconomic History  . Marital status: Married    Spouse name: Not on file  . Number of children: 2  . Years of education: Not on file  . Highest education level: Not on file  Occupational History  . Occupation: Sales  Tobacco Use  . Smoking status: Former Games developer  . Smokeless tobacco: Former Neurosurgeon    Types: Snuff  . Tobacco comment: during college  Vaping Use  . Vaping Use: Never used  Substance and Sexual Activity  . Alcohol use: Yes    Alcohol/week: 4.0 standard drinks    Types: 4 Cans of beer per week    Comment: 4-5 cans daily  . Drug use: No  . Sexual activity: Not on file  Other Topics Concern   . Not on file  Social History Narrative  . Not on file   Social Determinants of Health   Financial Resource Strain: Not on file  Food Insecurity: Not on file  Transportation Needs: Not on file  Physical Activity: Not on file  Stress: Not on file  Social Connections: Not on file   Past Medical History:  Diagnosis Date  . Essential hypertension   . Generalized anxiety disorder   . GERD without esophagitis   . Hyperlipidemia 01/23/2016  . Hypertension 2013  . Mixed hyperlipidemia    Past Surgical History:  Procedure Laterality Date  . None    . UPPER GASTROINTESTINAL ENDOSCOPY    . WISDOM TOOTH EXTRACTION      Family History  Problem Relation Age of Onset  . Breast cancer Mother   . Healthy Father   . Heart attack Maternal Grandfather   . Dementia Paternal Grandmother   . Other Paternal Grandfather        suicide  . Pancreatic disease Other        great aunt  . Colon cancer Neg Hx   . Rectal cancer Neg Hx   . Esophageal cancer Neg Hx   . Liver cancer Neg Hx   . Prostate cancer Neg Hx    Social History   Socioeconomic History  . Marital status: Married    Spouse name: Not on file  . Number of children:  2  . Years of education: Not on file  . Highest education level: Not on file  Occupational History  . Occupation: Sales  Tobacco Use  . Smoking status: Former Games developer  . Smokeless tobacco: Former Neurosurgeon    Types: Snuff  . Tobacco comment: during college  Vaping Use  . Vaping Use: Never used  Substance and Sexual Activity  . Alcohol use: Yes    Alcohol/week: 4.0 standard drinks    Types: 4 Cans of beer per week    Comment: 4-5 cans daily  . Drug use: No  . Sexual activity: Not on file  Other Topics Concern  . Not on file  Social History Narrative  . Not on file   Social Determinants of Health   Financial Resource Strain: Not on file  Food Insecurity: Not on file  Transportation Needs: Not on file  Physical Activity: Not on file  Stress: Not on  file  Social Connections: Not on file   Review of Systems  Constitutional: Negative for activity change, chills, fatigue and fever.  HENT: Negative for congestion and sinus pain.   Eyes: Negative for visual disturbance.  Respiratory: Negative for cough, shortness of breath and wheezing.   Cardiovascular: Negative for chest pain, palpitations and leg swelling.  Gastrointestinal: Positive for abdominal distention. Negative for abdominal pain.  Endocrine: Negative for polyuria.  Genitourinary: Negative for difficulty urinating, dysuria and urgency.  Musculoskeletal: Positive for back pain (lumbar).  Psychiatric/Behavioral: Negative for agitation, behavioral problems, confusion and decreased concentration.  rash on arms off and on Right hamstring pain. Doing tae quan do, no injuries. Some low back pain  Objective:  BP 122/80 (BP Location: Left Arm, Patient Position: Sitting, Cuff Size: Normal)   Pulse (!) 59   Temp (!) 97.3 F (36.3 C) (Temporal)   Resp 16   Ht 6\' 4"  (1.93 m)   Wt 222 lb 3.2 oz (100.8 kg)   SpO2 100%   BMI 27.05 kg/m   BP/Weight 06/21/2020 05/23/2020 05/08/2020  Systolic BP 122 121 122  Diastolic BP 80 83 70  Wt. (Lbs) 222.2 226 226  BMI 27.05 27.51 27.51    Physical Exam Vitals reviewed.  Constitutional:      General: He is not in acute distress.    Appearance: Normal appearance. He is not toxic-appearing.  HENT:     Head: Normocephalic.     Right Ear: Tympanic membrane, ear canal and external ear normal.     Left Ear: Tympanic membrane, ear canal and external ear normal.     Nose: Nose normal.     Mouth/Throat:     Mouth: Mucous membranes are moist.     Pharynx: Oropharynx is clear.  Eyes:     Extraocular Movements: Extraocular movements intact.     Conjunctiva/sclera: Conjunctivae normal.     Pupils: Pupils are equal, round, and reactive to light.  Cardiovascular:     Rate and Rhythm: Normal rate and regular rhythm.     Pulses: Normal pulses.      Heart sounds: Normal heart sounds.  Pulmonary:     Effort: Pulmonary effort is normal. No respiratory distress.     Breath sounds: Normal breath sounds. No rales.  Abdominal:     General: Abdomen is flat. Bowel sounds are normal. There is no distension.     Palpations: Abdomen is soft.     Tenderness: There is no abdominal tenderness.  Musculoskeletal:        General: Tenderness present.  Cervical back: Normal range of motion and neck supple.     Comments: Low back pain and tight hamstrings on right  Skin:    General: Skin is warm and dry.     Capillary Refill: Capillary refill takes less than 2 seconds.  Neurological:     General: No focal deficit present.     Mental Status: He is alert and oriented to person, place, and time.     Motor: No weakness.     Gait: Gait normal.  Psychiatric:        Mood and Affect: Mood normal.        Behavior: Behavior normal.        Thought Content: Thought content normal.        Judgment: Judgment normal.     Lab Results  Component Value Date   WBC 5.2 12/15/2019   HGB 16.0 12/15/2019   HCT 47.0 12/15/2019   PLT 265 12/15/2019   GLUCOSE 91 12/15/2019   CHOL 197 09/01/2019   TRIG 107 09/01/2019   HDL 60 09/01/2019   LDLCALC 118 (H) 09/01/2019   ALT 22 12/15/2019   AST 22 12/15/2019   NA 134 12/15/2019   K 4.4 12/15/2019   CL 93 (L) 12/15/2019   CREATININE 1.09 12/15/2019   BUN 14 12/15/2019   CO2 27 12/15/2019      Assessment & Plan:  1. Routine general medical examination at a health care facility - CBC with Differential/Platelet - Comprehensive metabolic panel - Lipid panel - PSA Had a routine physical with no abnormalities found.  He does have some chronic low back pain and hamstring tightness.  2. Chronic bilateral low back pain without sciatica - Ambulatory referral to Physical Therapy Patient has had a history of chronic lower back pain as well as tightness in his right hamstring which she has tried to stretch out  without benefit.  I recommend we do send him to physical therapy to see if we cannot stretch this out and he is the spasm.  3. Essential hypertension An individual hypertension care plan was established and reinforced today.  The patient's status was assessed using clinical findings on exam and labs or diagnostic tests. The patient's success at meeting treatment goals on disease specific evidence-based guidelines and found to be well controlled. SELF MANAGEMENT: The patient and I together assessed ways to personally work towards obtaining the recommended goals. RECOMMENDATIONS: avoid decongestants found in common cold remedies, decrease consumption of alcohol, perform routine monitoring of BP with home BP cuff, exercise, reduction of dietary salt, take medicines as prescribed, try not to miss doses and quit smoking.  Regular exercise and maintaining a healthy weight is needed.  Stress reduction may help. A CLINICAL SUMMARY including written plan identify barriers to care unique to individual due to social or financial issues.  We attempt to mutually creat solutions for individual and family understanding.  4. GERD without esophagitis Plan of care was formulated today.  She is doing well.  A plan of care was formulated using patient exam, tests and other sources to optimize care using evidence based information.  Recommend no smoking, no eating after supper, avoid fatty foods, elevate Head of bed, avoid tight fitting clothing.  Continue on pepcid.    Body mass index is 27.05 kg/m.   These are the goals we discussed: Goals    . Cut out extra servings        This is a list of the screening recommended for you  and due dates:  Health Maintenance  Topic Date Due  .  Hepatitis C: One time screening is recommended by Center for Disease Control  (CDC) for  adults born from [birth year] through 1967.   Never done  . HIV Screening  Never done  . Tetanus Vaccine  04/14/2024  . Flu Shot  Completed  . COVID-19  Vaccine  Completed     AN INDIVIDUALIZED CARE PLAN: was established or reinforced today.   SELF MANAGEMENT: The patient and I together assessed ways to personally work towards obtaining the recommended goals  Support needs The patient and/or family needs were assessed and services were offered if appropriate.     Follow-up: Return in about 1 year (around 06/21/2021) for fasting.  An After Visit Summary was printed and given to the patient.  Brent Bulla, MD [facility] [phone]

## 2020-06-22 LAB — CBC WITH DIFFERENTIAL/PLATELET
Basophils Absolute: 0 10*3/uL (ref 0.0–0.2)
Basos: 1 %
EOS (ABSOLUTE): 0.2 10*3/uL (ref 0.0–0.4)
Eos: 2 %
Hematocrit: 44.7 % (ref 37.5–51.0)
Hemoglobin: 14.9 g/dL (ref 13.0–17.7)
Immature Grans (Abs): 0 10*3/uL (ref 0.0–0.1)
Immature Granulocytes: 0 %
Lymphocytes Absolute: 2.6 10*3/uL (ref 0.7–3.1)
Lymphs: 37 %
MCH: 30.3 pg (ref 26.6–33.0)
MCHC: 33.3 g/dL (ref 31.5–35.7)
MCV: 91 fL (ref 79–97)
Monocytes Absolute: 0.8 10*3/uL (ref 0.1–0.9)
Monocytes: 11 %
Neutrophils Absolute: 3.4 10*3/uL (ref 1.4–7.0)
Neutrophils: 49 %
Platelets: 276 10*3/uL (ref 150–450)
RBC: 4.91 x10E6/uL (ref 4.14–5.80)
RDW: 11.8 % (ref 11.6–15.4)
WBC: 7 10*3/uL (ref 3.4–10.8)

## 2020-06-22 LAB — LIPID PANEL
Chol/HDL Ratio: 3.5 ratio (ref 0.0–5.0)
Cholesterol, Total: 199 mg/dL (ref 100–199)
HDL: 57 mg/dL (ref >39)
LDL Chol Calc (NIH): 125 mg/dL — ABNORMAL HIGH (ref 0–99)
Triglycerides: 92 mg/dL (ref 0–149)
VLDL Cholesterol Cal: 17 mg/dL (ref 5–40)

## 2020-06-22 LAB — COMPREHENSIVE METABOLIC PANEL
ALT: 31 IU/L (ref 0–44)
AST: 29 IU/L (ref 0–40)
Albumin/Globulin Ratio: 2 (ref 1.2–2.2)
Albumin: 4.7 g/dL (ref 4.0–5.0)
Alkaline Phosphatase: 59 IU/L (ref 44–121)
BUN/Creatinine Ratio: 13 (ref 9–20)
BUN: 14 mg/dL (ref 6–24)
Bilirubin Total: 0.7 mg/dL (ref 0.0–1.2)
CO2: 28 mmol/L (ref 20–29)
Calcium: 10.1 mg/dL (ref 8.7–10.2)
Chloride: 92 mmol/L — ABNORMAL LOW (ref 96–106)
Creatinine, Ser: 1.09 mg/dL (ref 0.76–1.27)
GFR calc Af Amer: 97 mL/min/{1.73_m2} (ref >59)
GFR calc non Af Amer: 84 mL/min/{1.73_m2} (ref >59)
Globulin, Total: 2.3 g/dL (ref 1.5–4.5)
Glucose: 99 mg/dL (ref 65–99)
Potassium: 4.8 mmol/L (ref 3.5–5.2)
Sodium: 133 mmol/L — ABNORMAL LOW (ref 134–144)
Total Protein: 7 g/dL (ref 6.0–8.5)

## 2020-06-22 LAB — CARDIOVASCULAR RISK ASSESSMENT

## 2020-06-22 LAB — PSA: Prostate Specific Ag, Serum: 0.5 ng/mL (ref 0.0–4.0)

## 2020-06-22 NOTE — Progress Notes (Signed)
CBC normal, kidney and liver tests normal, sodium low, may liberalize salt intake, LDL cholesterol high at 125 need DASH diet, PSA 0.5 normal lp

## 2020-07-06 DIAGNOSIS — Z1152 Encounter for screening for COVID-19: Secondary | ICD-10-CM | POA: Diagnosis not present

## 2020-07-06 DIAGNOSIS — Z03818 Encounter for observation for suspected exposure to other biological agents ruled out: Secondary | ICD-10-CM | POA: Diagnosis not present

## 2020-07-06 DIAGNOSIS — R0981 Nasal congestion: Secondary | ICD-10-CM | POA: Diagnosis not present

## 2020-07-18 ENCOUNTER — Other Ambulatory Visit: Payer: Self-pay

## 2020-07-18 ENCOUNTER — Ambulatory Visit: Payer: BLUE CROSS/BLUE SHIELD | Attending: Legal Medicine

## 2020-07-18 DIAGNOSIS — G8929 Other chronic pain: Secondary | ICD-10-CM

## 2020-07-18 DIAGNOSIS — S76301A Unspecified injury of muscle, fascia and tendon of the posterior muscle group at thigh level, right thigh, initial encounter: Secondary | ICD-10-CM

## 2020-07-18 DIAGNOSIS — M545 Low back pain, unspecified: Secondary | ICD-10-CM | POA: Diagnosis not present

## 2020-07-18 DIAGNOSIS — M6281 Muscle weakness (generalized): Secondary | ICD-10-CM | POA: Diagnosis not present

## 2020-07-18 NOTE — Therapy (Signed)
[facility] [address] [place], [place], [address] Phone: [phone]   Fax:  [phone]  Physical Therapy Evaluation  Patient Details  Name: Edward Terry MRN: [id number] Date of Birth: 31-Jan-1981 Referring Provider (PT): Brent Bulla, MD   Encounter Date: 07/18/2020   PT End of Session - 07/18/20 1811    Visit Number 1    Number of Visits 6    Date for PT Re-Evaluation 08/29/20    Authorization Type BCBS    PT Start Time 1505    PT Stop Time 1553    PT Time Calculation (min) 48 min    Activity Tolerance Patient tolerated treatment well    Behavior During Therapy [facility] for tasks assessed/performed           Past Medical History:  Diagnosis Date  . Essential hypertension   . Generalized anxiety disorder   . GERD without esophagitis   . Hyperlipidemia 01/23/2016  . Hypertension 2013  . Mixed hyperlipidemia     Past Surgical History:  Procedure Laterality Date  . None    . UPPER GASTROINTESTINAL ENDOSCOPY    . WISDOM TOOTH EXTRACTION      There were no vitals filed for this visit.    Subjective Assessment - 07/18/20 1508    Subjective Pt reports starting Taekwondo last April. He has a nagging pain 3-4/10 in his right hamstring. He actually does not have low back pain anymore. He took a little time off with the weather and felt better, but went back last night and it feels tight again. It feels "like a roll of quarters in the back of his leg". Used to be a runner, so knows how to stretch and foam roll, but not sure of what to do to get into that hamstring deeper. He goes to Taekwondo 3x/week Monday, Tuesday, Thursday. He loves [employer] and does not want to stop due to it being his fitness avenue. Sometimes, regardless of [place] and just feels tight. Driving 45 minutes, he starts to feel R HS tightness.    Pertinent History Plantar fasciitis last 2 years B feet    Limitations Sitting;Other (comment)    kicking in Taekwondo   How long can you sit comfortably? 45 min    Patient Stated Goals Figure out how to get rid of the hamstring pain    Currently in Pain? No/denies    Pain Score --   3-4/10 when it hurts   Pain Location Leg    Pain Orientation Posterior;Upper    Pain Descriptors / Indicators Aching;Nagging;Tightness    Pain Type Chronic pain    Pain Radiating Towards up and down hamstring    Pain Onset More than a month ago    Pain Frequency Constant   when irritated, it's constant   Aggravating Factors  driving 45 min or more, kicking in Taekwondo    Pain Relieving Factors stretching              [facility] PT Assessment - 07/18/20 0001      Assessment   Medical Diagnosis Chronic R Hamstring and LBP    Referring Provider (PT) Brent Bulla, MD    Onset Date/Surgical Date --   unsure, months ago   Hand Dominance Right    Next MD Visit PRN    Prior Therapy Yes for shoulder      Precautions   Precautions None      Restrictions   Weight Bearing Restrictions No  Balance Screen   Has the patient fallen in the past 6 months No    Has the patient had a decrease in activity level because of a fear of falling?  Yes    Is the patient reluctant to leave their home because of a fear of falling?  No      Home Tourist information centre manager residence      Prior Function   Vocation Full time employment    Vocation Requirements sitting a lot    Leisure Taekwondo      Posture/Postural Control   Posture Comments WFL      ROM / Strength   AROM / PROM / Strength AROM      AROM   AROM Assessment Site Lumbar    Lumbar Flexion fingertips to floor    Lumbar Extension WFL    Lumbar - Right Side Bend WFL fingertips to knee joint line    Lumbar - Left Side Bend fingertips above knee    Lumbar - Right Rotation 20% decreased    Lumbar - Left Rotation [facility]      Flexibility   Soft Tissue Assessment /Muscle Length yes    Hamstrings -15 R LE, -25 L LE from popliteal  angle      Palpation   Spinal mobility WFL lumbar spine    Palpation comment TTP to B midbelly hamstrings      Special Tests   Other special tests Double straight leg lowers, unable to maintain imprinted spine and core connection after 60                      Objective measurements completed on examination: See above findings.                 PT Short Term Goals - 07/18/20 1810      PT SHORT TERM GOAL #1   Title Pt will be independent and compliant with initial HEP.    Baseline given at eval    Time 2    Period Weeks    Status New    Target Date 08/01/20      PT SHORT TERM GOAL #2   Title FOTO will taken by visit #3 and long term goal created.    Baseline no FOTO    Time 3    Period Weeks    Status New    Target Date 08/08/20             PT Long Term Goals - 07/18/20 1811      PT LONG TERM GOAL #1   Title Pt will be independent with long term HEP for continued progression and prevention of recurrence.    Time 6    Period Weeks    Status New    Target Date 08/29/20      PT LONG TERM GOAL #2   Title Pt will demonstrate symmetrical lumbar AROM.    Baseline decreased left lateral flexion and right rotation    Time 6    Period Weeks    Status New    Target Date 08/29/20      PT LONG TERM GOAL #3   Title Pt will report minimal to no pain with daily activity and leisure.    Baseline 3-4/10    Time 6    Period Weeks    Status New    Target Date 08/29/20      PT LONG TERM GOAL #4  Title Pt will perform double straight leg lowers to <45, maintaining imprinted lumbar spine, to demonstrate improved core strength.    Baseline 60    Time 6    Period Weeks    Status New    Target Date 08/29/20                  Plan - 07/18/20 1644    Clinical Impression Statement Pt is a 40 yo male who presents with R hamstring pain, of months duration, with insidious onset from Taekwondo. Pt began Taekwondo last April and experiences pain  with high velocity kicking intermittently. Pt did have low back pain acutely that has now resolved since his referral. Upon further investigation, pt reports hamstring pain with prolonged sitting, worse with driving. Pain alleviates with stretching, but pt has minimal limitations in hamstring flexibility (-15 from popliteal angle R LE, -25 L LE) with no symptom reproduction. Multiple nodules in midbelly hamstring B that are TTP. Lumbar AROM is WFL, but mildly decreased into left lateral flexion and right rotation, with no TTP to lumbar spine. Pt does demo impairments in glute, hamstring, and lower abdominal strength. Educated pt on impairments, lumbar extension throughout the day due to sitting a lot for work and avoiding prolonged standing 2 B plantar fascia issues, HEP, diagnosis, prognosis, and POC. Pt verbalized understanding and consent to tx. He will benefit from skilled physical therapy 1x/week for 4-6 weeks to address proximal stability for distal mobility with exercise.    Personal Factors and Comorbidities Comorbidity 2;Past/Current Experience;Time since onset of injury/illness/exacerbation    Comorbidities HTN, HLD    Examination-Activity Limitations Sit    Examination-Participation Restrictions Driving;Community Activity    Stability/Clinical Decision Making Stable/Uncomplicated    Clinical Decision Making Low    Rehab Potential Good    PT Frequency 1x / week    PT Duration --   4-6 weeks   PT Treatment/Interventions ADLs/Self Care Home Management;Spinal Manipulations;Passive range of motion;Dry needling;Taping;Manual techniques;Patient/family education;Neuromuscular re-education;Therapeutic exercise;Balance training;Functional mobility training;Therapeutic activities;Moist Heat;Vasopneumatic Device;Electrical Stimulation;Cryotherapy    PT Next Visit Plan FOTO, assess response to HEP and lumbar roll for driving, progress inner core/neutral spine strengthening, progress glute and hamstring  strength    PT Home Exercise Plan [email or web address]    Consulted and Agree with Plan of Care Patient           Patient will benefit from skilled therapeutic intervention in order to improve the following deficits and impairments:  Pain,Increased fascial restricitons,Decreased strength,Decreased range of motion  Visit Diagnosis: Right hamstring injury, initial encounter - Plan: PT plan of care cert/re-cert  Chronic bilateral low back pain without sciatica - Plan: PT plan of care cert/re-cert  Muscle weakness (generalized) - Plan: PT plan of care cert/re-cert     Problem List Patient Active Problem List   Diagnosis Date Noted  . Routine general medical examination at a health care facility 06/21/2020  . Essential hypertension   . Mixed hyperlipidemia   . GERD without esophagitis   . Generalized anxiety disorder   . Low back pain 08/14/2007  . ELEVATED BP READING WITHOUT DX HYPERTENSION 08/14/2007  . ROTATOR CUFF SPRAIN AND STRAIN 08/14/2007  . CALLUSES, RIGHT FOOT 04/21/2007  . Pain in limb 04/21/2007    Marcelline Mates, PT, DPT 07/18/2020, 6:21 PM  [facility] [address] [place], [place], [address] Phone: [phone]   Fax:  [phone]  Name: Edward Terry MRN: [id number] Date of Birth: 22-Nov-1980

## 2020-07-18 NOTE — Patient Instructions (Signed)
Access Code: [email or web address] URL: [email or web address] Date: 07/18/2020 Prepared by: Gardiner Rhyme  Exercises Standing Lumbar Extension - 3-5 x daily - 7 x weekly - 1-2 sets - 10 reps Supine Bridge - 1 x daily - 7 x weekly - 2 sets - 10-15 reps Prone Hip Extension - 1 x daily - 7 x weekly - 1 sets - 10 reps - 3-5 seconds hold Prone Hip Extension with Bent Knee - 1 x daily - 7 x weekly - 1 sets - 10 reps - 3-5 seconds hold

## 2020-07-26 ENCOUNTER — Ambulatory Visit (INDEPENDENT_AMBULATORY_CARE_PROVIDER_SITE_OTHER): Payer: BLUE CROSS/BLUE SHIELD | Admitting: Legal Medicine

## 2020-07-26 ENCOUNTER — Other Ambulatory Visit: Payer: Self-pay

## 2020-07-26 ENCOUNTER — Encounter: Payer: Self-pay | Admitting: Legal Medicine

## 2020-07-26 VITALS — BP 132/84 | HR 84 | Temp 97.2°F | Resp 16 | Ht 76.0 in | Wt 227.2 lb

## 2020-07-26 DIAGNOSIS — L2081 Atopic neurodermatitis: Secondary | ICD-10-CM

## 2020-07-26 DIAGNOSIS — L739 Follicular disorder, unspecified: Secondary | ICD-10-CM

## 2020-07-26 DIAGNOSIS — L209 Atopic dermatitis, unspecified: Secondary | ICD-10-CM | POA: Insufficient documentation

## 2020-07-26 MED ORDER — TRIAMCINOLONE ACETONIDE 40 MG/ML IJ SUSP
80.0000 mg | Freq: Once | INTRAMUSCULAR | Status: AC
  Administered 2020-07-26: 80 mg via INTRAMUSCULAR

## 2020-07-26 MED ORDER — CEPHALEXIN 500 MG PO CAPS
500.0000 mg | ORAL_CAPSULE | Freq: Four times a day (QID) | ORAL | 0 refills | Status: DC
Start: 2020-07-26 — End: 2021-05-02

## 2020-07-26 NOTE — Progress Notes (Signed)
Subjective:  Patient ID: Edward Terry, male    DOB: 09-Jul-1980  Age: 40 y.o. MRN: [id number]  Chief Complaint  Patient presents with  . Rash    All over body, has been off and on for a few months. Patient is concerned it is a reaction to Pantoprazole, he has been switched to Omeprazole but rash has worsened. Patient complains of itchiness and irritation. He has tried OTC HC cr and calamine lotion.    HPI: rash on abdomen, arms and back.  Some on hips that is macular and dry.  It is pruritic.  He also has foliculitis in chest wall and axilla. No drainage. He has a history of asthma and allergies.   Current Outpatient Medications on File Prior to Visit  Medication Sig Dispense Refill  . atorvastatin (LIPITOR) 10 MG tablet TAKE 1 TABLET BY MOUTH EVERY DAY 30 tablet 8  . chlorthalidone (HYGROTON) 25 MG tablet TAKE 1 TABLET BY MOUTH EVERY DAY 30 tablet 11  . clonazePAM (KLONOPIN) 0.5 MG tablet TAKE 1 TABLET BY MOUTH EVERY DAY AS NEEDED FOR ANXIETY 30 tablet 3  . famotidine (PEPCID) 20 MG tablet Take 1 tablet (20 mg total) by mouth at bedtime. 90 tablet 3  . lisinopril (ZESTRIL) 10 MG tablet TAKE 1 TABLET BY MOUTH EVERY DAY 90 tablet 3  . omeprazole (PRILOSEC) 40 MG capsule Take 1 capsule (40 mg total) by mouth daily. 90 capsule 3   No current facility-administered medications on file prior to visit.   Past Medical History:  Diagnosis Date  . Essential hypertension   . Generalized anxiety disorder   . GERD without esophagitis   . Hyperlipidemia 01/23/2016  . Hypertension 2013  . Mixed hyperlipidemia    Past Surgical History:  Procedure Laterality Date  . None    . UPPER GASTROINTESTINAL ENDOSCOPY    . WISDOM TOOTH EXTRACTION      Family History  Problem Relation Age of Onset  . Breast cancer Mother   . Healthy Father   . Heart attack Maternal Grandfather   . Dementia Paternal Grandmother   . Other Paternal Grandfather        suicide  . Pancreatic disease Other         great aunt  . Colon cancer Neg Hx   . Rectal cancer Neg Hx   . Esophageal cancer Neg Hx   . Liver cancer Neg Hx   . Prostate cancer Neg Hx    Social History   Socioeconomic History  . Marital status: Married    Spouse name: Not on file  . Number of children: 2  . Years of education: Not on file  . Highest education level: Not on file  Occupational History  . Occupation: Sales  Tobacco Use  . Smoking status: Former Games developer  . Smokeless tobacco: Former Neurosurgeon    Types: Snuff  . Tobacco comment: during college  Vaping Use  . Vaping Use: Never used  Substance and Sexual Activity  . Alcohol use: Yes    Alcohol/week: 4.0 standard drinks    Types: 4 Cans of beer per week    Comment: 4-5 cans daily  . Drug use: No  . Sexual activity: Not on file  Other Topics Concern  . Not on file  Social History Narrative  . Not on file   Social Determinants of Health   Financial Resource Strain: Not on file  Food Insecurity: Not on file  Transportation Needs: Not on file  Physical  Activity: Not on file  Stress: Not on file  Social Connections: Not on file    Review of Systems  Constitutional: Negative for activity change and diaphoresis.  HENT: Negative for congestion and sinus pain.   Eyes: Negative for visual disturbance.  Respiratory: Negative for shortness of breath and wheezing.   Cardiovascular: Negative for chest pain, palpitations and leg swelling.  Gastrointestinal: Negative for abdominal distention and abdominal pain.  Endocrine: Negative for polyuria.  Genitourinary: Negative for difficulty urinating, dysuria and urgency.  Musculoskeletal: Negative for arthralgias and back pain.  Neurological: Negative.   Psychiatric/Behavioral: Negative.      Objective:  BP 132/84 (BP Location: Right Arm, Patient Position: Sitting, Cuff Size: Normal)   Pulse 84   Temp (!) 97.2 F (36.2 C) (Temporal)   Resp 16   Ht 6\' 4"  (1.93 m)   Wt 227 lb 3.2 oz (103.1 kg)   SpO2 96%   BMI  27.66 kg/m   BP/Weight 07/26/2020 06/21/2020 05/23/2020  Systolic BP 132 122 121  Diastolic BP 84 80 83  Wt. (Lbs) 227.2 222.2 226  BMI 27.66 27.05 27.51    Physical Exam Vitals reviewed.  Constitutional:      Appearance: Normal appearance.  Eyes:     Extraocular Movements: Extraocular movements intact.     Conjunctiva/sclera: Conjunctivae normal.     Pupils: Pupils are equal, round, and reactive to light.  Cardiovascular:     Rate and Rhythm: Normal rate and regular rhythm.     Pulses: Normal pulses.     Heart sounds: Normal heart sounds.  Pulmonary:     Effort: Pulmonary effort is normal.     Breath sounds: Normal breath sounds.  Musculoskeletal:     Cervical back: Normal range of motion and neck supple.  Skin:    Capillary Refill: Capillary refill takes less than 2 seconds.     Comments: Maculopapular rash on arms, trunk and hips that has dry skin and positive triple reaction  Patient has pustulosis with folliculitis in axilla and chest wall.  Neurological:     General: No focal deficit present.     Mental Status: He is alert and oriented to person, place, and time. Mental status is at baseline.       Lab Results  Component Value Date   WBC 7.0 06/21/2020   HGB 14.9 06/21/2020   HCT 44.7 06/21/2020   PLT 276 06/21/2020   GLUCOSE 99 06/21/2020   CHOL 199 06/21/2020   TRIG 92 06/21/2020   HDL 57 06/21/2020   LDLCALC 125 (H) 06/21/2020   ALT 31 06/21/2020   AST 29 06/21/2020   NA 133 (L) 06/21/2020   K 4.8 06/21/2020   CL 92 (L) 06/21/2020   CREATININE 1.09 06/21/2020   BUN 14 06/21/2020   CO2 28 06/21/2020      Assessment & Plan:   Diagnoses and all orders for this visit: Atopic neurodermatitis -     triamcinolone acetonide (KENALOG-40) injection 80 mg Shower every other day and use moiturizer daily, Kenalog injection 80mg  IM  Folliculitis -     cephALEXin (KEFLEX) 500 MG capsule; Take 1 capsule (500 mg total) by mouth 4 (four) times  daily. Keflex for 10 days for folliculitis         I spent 15 minutes dedicated to the care of this patient on the date of this encounter to include face-to-face time with the patient, as well as:   Follow-up: Return if symptoms worsen or fail  to improve.  An After Visit Summary was printed and given to the patient.  Brent Bulla, MD [facility] [phone]

## 2020-08-01 ENCOUNTER — Ambulatory Visit: Payer: BLUE CROSS/BLUE SHIELD | Attending: Legal Medicine

## 2020-08-01 ENCOUNTER — Other Ambulatory Visit: Payer: Self-pay

## 2020-08-01 DIAGNOSIS — M6281 Muscle weakness (generalized): Secondary | ICD-10-CM

## 2020-08-01 DIAGNOSIS — G8929 Other chronic pain: Secondary | ICD-10-CM | POA: Diagnosis not present

## 2020-08-01 DIAGNOSIS — M545 Low back pain, unspecified: Secondary | ICD-10-CM | POA: Diagnosis not present

## 2020-08-01 DIAGNOSIS — S76301A Unspecified injury of muscle, fascia and tendon of the posterior muscle group at thigh level, right thigh, initial encounter: Secondary | ICD-10-CM | POA: Diagnosis not present

## 2020-08-01 NOTE — Therapy (Signed)
[facility] [address] [place], [place], [address] Phone: [phone]   Fax:  [phone]  Physical Therapy Treatment  Patient Details  Name: Edward Terry MRN: [id number] Date of Birth: 1981/03/28 Referring Provider (PT): Brent Bulla, MD   Encounter Date: 08/01/2020   PT End of Session - 08/01/20 1203    Visit Number 2    Number of Visits 6    Date for PT Re-Evaluation 08/29/20    Authorization Type BCBS    PT Start Time 1157   pt arrived late   PT Stop Time 1235    PT Time Calculation (min) 38 min    Activity Tolerance Patient tolerated treatment well    Behavior During Therapy [facility] for tasks assessed/performed           Past Medical History:  Diagnosis Date  . Essential hypertension   . Generalized anxiety disorder   . GERD without esophagitis   . Hyperlipidemia 01/23/2016  . Hypertension 2013  . Mixed hyperlipidemia     Past Surgical History:  Procedure Laterality Date  . None    . UPPER GASTROINTESTINAL ENDOSCOPY    . WISDOM TOOTH EXTRACTION      There were no vitals filed for this visit.   Subjective Assessment - 08/01/20 1201    Subjective Pt reports less of his hamstring pain and doing his exercises a couple of times a day, whenever he can stop and do something. He doesn't always get to. Double leg lowers and standing extensions are helping the most.    Pertinent History Plantar fasciitis last 2 years B feet    Limitations Sitting;Other (comment)   kicking in Taekwondo   How long can you sit comfortably? 45 min    Patient Stated Goals Figure out how to get rid of the hamstring pain    Currently in Pain? No/denies    Pain Score 0-No pain    Pain Location Leg    Pain Orientation Right    Pain Onset More than a month ago                             [facility] Adult PT Treatment/Exercise - 08/01/20 0001      Self-Care   Self-Care Other Self-Care Comments    Other Self-Care  Comments  performing some reps of series of 5 daily with 1st goal to be 5 reps of each exercise and progress to 10      Exercises   Exercises Lumbar      Pilates   Pilates Mat Series of 5      Lumbar Exercises: Aerobic   Elliptical R5, L4 5'      Lumbar Exercises: Supine   Bridge with Ball Squeeze 15 reps;3 seconds    Bridge with [employer] Limitations added LE reaches x6 B      Lumbar Exercises: Prone   Other Prone Lumbar Exercises Plank on forearms   incr midback rounding, unable to maintain neutral pelvis and correct spinal alignment                 PT Education - 08/01/20 1321    Education Details Pilates Series of 5 form, videos for home practice and video of plank form    Person(s) Educated Patient    Methods Explanation;Demonstration;Tactile cues;Verbal cues    Comprehension Verbalized understanding;Returned demonstration;Need further instruction;Verbal cues required;Tactile cues required  PT Short Term Goals - 07/18/20 1810      PT SHORT TERM GOAL #1   Title Pt will be independent and compliant with initial HEP.    Baseline given at eval    Time 2    Period Weeks    Status New    Target Date 08/01/20      PT SHORT TERM GOAL #2   Title FOTO will taken by visit #3 and long term goal created.    Baseline no FOTO    Time 3    Period Weeks    Status New    Target Date 08/08/20             PT Long Term Goals - 07/18/20 1811      PT LONG TERM GOAL #1   Title Pt will be independent with long term HEP for continued progression and prevention of recurrence.    Time 6    Period Weeks    Status New    Target Date 08/29/20      PT LONG TERM GOAL #2   Title Pt will demonstrate symmetrical lumbar AROM.    Baseline decreased left lateral flexion and right rotation    Time 6    Period Weeks    Status New    Target Date 08/29/20      PT LONG TERM GOAL #3   Title Pt will report minimal to no pain with daily activity and leisure.     Baseline 3-4/10    Time 6    Period Weeks    Status New    Target Date 08/29/20      PT LONG TERM GOAL #4   Title Pt will perform double straight leg lowers to <45, maintaining imprinted lumbar spine, to demonstrate improved core strength.    Baseline 60    Time 6    Period Weeks    Status New    Target Date 08/29/20                 Plan - 08/01/20 1203    Clinical Impression Statement Pt presents 2 weeks post eval with reduction in symptoms and good diligence to HEP. He demonstrates improved lumbar imprinting with double leg lowers today. Added Pilates series of 5 to home program with videos of patient performing exercises + verbal cues from therapist. Initiated planking with patient demonstrating compensation in upper back rounding and difficulty differentitating pelvic motion from lumbar motion. Attempted QP neutral spine with pelvic tilting with similar difficulty. Advised pt to practice at home with foam roller along spine to find neutral spine for improved planking.    Personal Factors and Comorbidities Comorbidity 2;Past/Current Experience;Time since onset of injury/illness/exacerbation    Comorbidities HTN, HLD    Examination-Activity Limitations Sit    Examination-Participation Restrictions Driving;Community Activity    Stability/Clinical Decision Making Stable/Uncomplicated    Rehab Potential Good    PT Frequency 1x / week    PT Duration --   4-6 weeks   PT Treatment/Interventions ADLs/Self Care Home Management;Spinal Manipulations;Passive range of motion;Dry needling;Taping;Manual techniques;Patient/family education;Neuromuscular re-education;Therapeutic exercise;Balance training;Functional mobility training;Therapeutic activities;Moist Heat;Vasopneumatic Device;Electrical Stimulation;Cryotherapy    PT Next Visit Plan FOTO, assess response to HEP and lumbar roll for driving, progress inner core/neutral spine strengthening, progress glute and hamstring strength    PT  Home Exercise Plan [email or web address]    Consulted and Agree with Plan of Care Patient           Patient will benefit  from skilled therapeutic intervention in order to improve the following deficits and impairments:  Pain,Increased fascial restricitons,Decreased strength,Decreased range of motion  Visit Diagnosis: Right hamstring injury, initial encounter  Chronic bilateral low back pain without sciatica  Muscle weakness (generalized)     Problem List Patient Active Problem List   Diagnosis Date Noted  . Folliculitis 07/26/2020  . Atopic dermatitis 07/26/2020  . Routine general medical examination at a health care facility 06/21/2020  . Essential hypertension   . Mixed hyperlipidemia   . GERD without esophagitis   . Generalized anxiety disorder   . Low back pain 08/14/2007  . ELEVATED BP READING WITHOUT DX HYPERTENSION 08/14/2007  . ROTATOR CUFF SPRAIN AND STRAIN 08/14/2007  . CALLUSES, RIGHT FOOT 04/21/2007  . Pain in limb 04/21/2007    Marcelline Mates, PT, DPT 08/01/2020, 1:23 PM  [facility] [address] [place], [place], [address] Phone: [phone]   Fax:  [phone]  Name: Edward Terry MRN: [id number] Date of Birth: Oct 19, 1980

## 2020-08-08 ENCOUNTER — Other Ambulatory Visit: Payer: Self-pay

## 2020-08-08 ENCOUNTER — Ambulatory Visit: Payer: BLUE CROSS/BLUE SHIELD

## 2020-08-08 DIAGNOSIS — S76301A Unspecified injury of muscle, fascia and tendon of the posterior muscle group at thigh level, right thigh, initial encounter: Secondary | ICD-10-CM

## 2020-08-08 DIAGNOSIS — M6281 Muscle weakness (generalized): Secondary | ICD-10-CM

## 2020-08-08 DIAGNOSIS — G8929 Other chronic pain: Secondary | ICD-10-CM

## 2020-08-08 DIAGNOSIS — M545 Low back pain, unspecified: Secondary | ICD-10-CM | POA: Diagnosis not present

## 2020-08-08 NOTE — Therapy (Signed)
[facility] [address] [place], [place], [address] Phone: [phone]   Fax:  [phone]  Physical Therapy Treatment  Patient Details  Name: Edward Terry MRN: [id number] Date of Birth: 05-07-1981 Referring Provider (PT): Brent Bulla, MD   Encounter Date: 08/08/2020   PT End of Session - 08/08/20 1235    Visit Number 3    Number of Visits 6    Date for PT Re-Evaluation 08/29/20    Authorization Type BCBS    PT Start Time 1142    PT Stop Time 1228    PT Time Calculation (min) 46 min    Activity Tolerance Patient tolerated treatment well    Behavior During Therapy [facility] for tasks assessed/performed           Past Medical History:  Diagnosis Date  . Essential hypertension   . Generalized anxiety disorder   . GERD without esophagitis   . Hyperlipidemia 01/23/2016  . Hypertension 2013  . Mixed hyperlipidemia     Past Surgical History:  Procedure Laterality Date  . None    . UPPER GASTROINTESTINAL ENDOSCOPY    . WISDOM TOOTH EXTRACTION      There were no vitals filed for this visit.   Subjective Assessment - 08/08/20 1146    Subjective Pt rerpots his R hamstring is not as noticeable and when he feels it, it's not as bad.    Pertinent History Plantar fasciitis last 2 years B feet    Limitations Sitting;Other (comment)   kicking in Taekwondo   How long can you sit comfortably? 45 min    Patient Stated Goals Figure out how to get rid of the hamstring pain    Currently in Pain? No/denies    Pain Score 0-No pain    Pain Onset More than a month ago                             [facility] Adult PT Treatment/Exercise - 08/08/20 0001      Self-Care   Other Self-Care Comments  practicing today his modified plank form and full plank form      Pilates   Pilates Mat Series of 5      Lumbar Exercises: Stretches   Active Hamstring Stretch Limitations seated on edge of table and long sit with flat  back focus      Lumbar Exercises: Supine   Bridge with [employer] 15 reps;5 seconds    Bridge with [employer] Limitations added LE reaches x6 B      Lumbar Exercises: Sidelying   Other Sidelying Lumbar Exercises Ball b/t ankles for leg lifts x5, upper body lift x5 (working sidebody/TrA)      Lumbar Exercises: Prone   Straight Leg Raise 10 reps;5 seconds   3 reps with ball squeeze L only   Straight Leg Raises Limitations knee bent    Other Prone Lumbar Exercises Modified on knees, forearm with FR along spine   max verbal/tactile cues for neutral spine with compensation at mid-lower T/S PS, tendency to extend at upper cervical   Other Prone Lumbar Exercises Serratus punches on tall mat table x10      Lumbar Exercises: Quadruped   Straight Leg Raise 10 reps;3 seconds    Straight Leg Raises Limitations bent knee on forearms                  PT Education - 08/08/20 1237  Education Details see self care comments    Person(s) Educated Patient    Methods Explanation;Demonstration;Verbal cues;Tactile cues    Comprehension Verbalized understanding;Returned demonstration;Verbal cues required;Tactile cues required            PT Short Term Goals - 07/18/20 1810      PT SHORT TERM GOAL #1   Title Pt will be independent and compliant with initial HEP.    Baseline given at eval    Time 2    Period Weeks    Status New    Target Date 08/01/20      PT SHORT TERM GOAL #2   Title FOTO will taken by visit #3 and long term goal created.    Baseline no FOTO    Time 3    Period Weeks    Status New    Target Date 08/08/20             PT Long Term Goals - 07/18/20 1811      PT LONG TERM GOAL #1   Title Pt will be independent with long term HEP for continued progression and prevention of recurrence.    Time 6    Period Weeks    Status New    Target Date 08/29/20      PT LONG TERM GOAL #2   Title Pt will demonstrate symmetrical lumbar AROM.    Baseline decreased left  lateral flexion and right rotation    Time 6    Period Weeks    Status New    Target Date 08/29/20      PT LONG TERM GOAL #3   Title Pt will report minimal to no pain with daily activity and leisure.    Baseline 3-4/10    Time 6    Period Weeks    Status New    Target Date 08/29/20      PT LONG TERM GOAL #4   Title Pt will perform double straight leg lowers to <45, maintaining imprinted lumbar spine, to demonstrate improved core strength.    Baseline 60    Time 6    Period Weeks    Status New    Target Date 08/29/20                 Plan - 08/08/20 1235    Clinical Impression Statement Pt was not diligent with HEP this week, but did carry over fairly well. He continues to have difficulty with plank form and neutral spine, notable compensations in thoracic extensors. Pt was educated to work on neutral spine with FR or stick. Progressed neutral spine with sidelying leg/upper body lifts as well.    Personal Factors and Comorbidities Comorbidity 2;Past/Current Experience;Time since onset of injury/illness/exacerbation    Comorbidities HTN, HLD    Examination-Activity Limitations Sit    Examination-Participation Restrictions Driving;Community Activity    Stability/Clinical Decision Making Stable/Uncomplicated    Rehab Potential Good    PT Frequency 1x / week    PT Duration --   4-6 weeks   PT Treatment/Interventions ADLs/Self Care Home Management;Spinal Manipulations;Passive range of motion;Dry needling;Taping;Manual techniques;Patient/family education;Neuromuscular re-education;Therapeutic exercise;Balance training;Functional mobility training;Therapeutic activities;Moist Heat;Vasopneumatic Device;Electrical Stimulation;Cryotherapy    PT Next Visit Plan Assess response to HEP and lumbar roll for driving, progress inner core/neutral spine strengthening, progress glute and hamstring strength    PT Home Exercise Plan [id number]    Consulted and Agree with Plan of Care Patient            Patient  will benefit from skilled therapeutic intervention in order to improve the following deficits and impairments:  Pain,Increased fascial restricitons,Decreased strength,Decreased range of motion  Visit Diagnosis: Right hamstring injury, initial encounter  Chronic bilateral low back pain without sciatica  Muscle weakness (generalized)     Problem List Patient Active Problem List   Diagnosis Date Noted  . Folliculitis 07/26/2020  . Atopic dermatitis 07/26/2020  . Routine general medical examination at a health care facility 06/21/2020  . Essential hypertension   . Mixed hyperlipidemia   . GERD without esophagitis   . Generalized anxiety disorder   . Low back pain 08/14/2007  . ELEVATED BP READING WITHOUT DX HYPERTENSION 08/14/2007  . ROTATOR CUFF SPRAIN AND STRAIN 08/14/2007  . CALLUSES, RIGHT FOOT 04/21/2007  . Pain in limb 04/21/2007    Marcelline Mates, PT, DPT 08/08/2020, 12:38 PM  [facility] [address] [place], [place], [address] Phone: [phone]   Fax:  [phone]  Name: Edward Terry MRN: [id number] Date of Birth: Feb 18, 1981

## 2020-08-24 ENCOUNTER — Other Ambulatory Visit: Payer: Self-pay

## 2020-08-24 ENCOUNTER — Ambulatory Visit: Payer: BLUE CROSS/BLUE SHIELD | Attending: Legal Medicine

## 2020-08-24 DIAGNOSIS — S76301A Unspecified injury of muscle, fascia and tendon of the posterior muscle group at thigh level, right thigh, initial encounter: Secondary | ICD-10-CM

## 2020-08-24 DIAGNOSIS — M6281 Muscle weakness (generalized): Secondary | ICD-10-CM | POA: Insufficient documentation

## 2020-08-24 DIAGNOSIS — G8929 Other chronic pain: Secondary | ICD-10-CM

## 2020-08-24 DIAGNOSIS — M545 Low back pain, unspecified: Secondary | ICD-10-CM | POA: Diagnosis not present

## 2020-08-24 NOTE — Therapy (Signed)
[place], [place], [address] Phone: [phone]   Fax:  [phone]  Physical Therapy Treatment  Patient Details  Name: Edward Terry MRN: [id number] Date of Birth: 09-08-1980 Referring Provider (PT): Reinaldo Meeker, MD   Encounter Date: 08/24/2020   PT End of Session - 08/24/20 1246    Visit Number 4    Number of Visits 6    Date for PT Re-Evaluation 08/29/20    Authorization Type BCBS    PT Start Time 1146    PT Stop Time 6962    PT Time Calculation (min) 49 min    Activity Tolerance Patient tolerated treatment well    Behavior During Therapy [facility] for tasks assessed/performed           Past Medical History:  Diagnosis Date  . Essential hypertension   . Generalized anxiety disorder   . GERD without esophagitis   . Hyperlipidemia 01/23/2016  . Hypertension 2013  . Mixed hyperlipidemia     Past Surgical History:  Procedure Laterality Date  . None    . UPPER GASTROINTESTINAL ENDOSCOPY    . WISDOM TOOTH EXTRACTION      There were no vitals filed for this visit.                      [facility] Adult PT Treatment/Exercise - 08/24/20 0001      Pilates   Pilates Mat Series of 5    Other Pilates tabletop hold with ball ADD      Lumbar Exercises: Sidelying   Other Sidelying Lumbar Exercises Ball b/t ankles for leg lifts x6, hold top leg and adduct bottom leg, CKC abduction      Lumbar Exercises: Prone   Other Prone Lumbar Exercises Serratus punches on tall mat table x10   airex under knees     Lumbar Exercises: Quadruped   Straight Leg Raise 10 reps;3 seconds    Straight Leg Raises Limitations on airex                  PT Education - 08/24/20 1255    Education Details edu for practicing R TrA activaiton in L S/L, CKC hip abd in S/L    Person(s) Educated Patient    Methods Explanation;Demonstration;Tactile cues;Verbal cues    Comprehension Verbalized  understanding;Returned demonstration;Verbal cues required;Tactile cues required            PT Short Term Goals - 08/24/20 1301      PT SHORT TERM GOAL #1   Title Pt will be independent and compliant with initial HEP.    Baseline given at eval    Time 2    Period Weeks    Status Achieved    Target Date 08/01/20      PT SHORT TERM GOAL #2   Title FOTO will taken by visit #3 and long term goal created.    Baseline no FOTO    Time 3    Period Weeks    Status Not Met    Target Date 08/08/20             PT Long Term Goals - 08/24/20 1301      PT LONG TERM GOAL #1   Title Pt will be independent with long term HEP for continued progression and prevention of recurrence.    Time 6    Period Weeks    Status On-going      PT LONG TERM  GOAL #2   Title Pt will demonstrate symmetrical lumbar AROM.    Time 6    Period Weeks    Status On-going      PT LONG TERM GOAL #3   Title Pt will report minimal to no pain with daily activity and leisure.    Baseline no pain at all the last week    Time 6    Period Weeks    Status Achieved      PT LONG TERM GOAL #4   Title Pt will perform double straight leg lowers to <45, maintaining imprinted lumbar spine, to demonstrate improved core strength.    Baseline 45 when not tired    Time 6    Period Weeks    Status On-going                 Plan - 08/24/20 1247    Clinical Impression Statement Pt has been practicing home program, which is evident in form for series of 5 quadruped positioning. Right sided TrA and multifidi weakness became evident with L S/L series: difficulty achieving hip and pelvic neutral, delayed firing of TrA, increased help from upper body. Attempted multifidi lift offs in QP with significant difficulty. Educated to focus on CKC hip abduction in sidelying for opposite side TrA lift and to try to hold that as he performs kicks for warm-up.    Personal Factors and Comorbidities Comorbidity 2;Past/Current  Experience;Time since onset of injury/illness/exacerbation    Comorbidities HTN, HLD    Examination-Activity Limitations Sit    Examination-Participation Restrictions Driving;Community Activity    Stability/Clinical Decision Making Stable/Uncomplicated    Rehab Potential Good    PT Frequency 1x / week    PT Duration --   4-6 weeks   PT Treatment/Interventions ADLs/Self Care Home Management;Spinal Manipulations;Passive range of motion;Dry needling;Taping;Manual techniques;Patient/family education;Neuromuscular re-education;Therapeutic exercise;Balance training;Functional mobility training;Therapeutic activities;Moist Heat;Vasopneumatic Device;Electrical Stimulation;Cryotherapy    PT Next Visit Plan Progress right sided inner core strengthening, alignment    PT Home Exercise Plan [id number]    Consulted and Agree with Plan of Care Patient           Patient will benefit from skilled therapeutic intervention in order to improve the following deficits and impairments:  Pain,Increased fascial restricitons,Decreased strength,Decreased range of motion  Visit Diagnosis: Right hamstring injury, initial encounter  Chronic bilateral low back pain without sciatica  Muscle weakness (generalized)     Problem List Patient Active Problem List   Diagnosis Date Noted  . Folliculitis 65/46/5035  . Atopic dermatitis 07/26/2020  . Routine general medical examination at a health care facility 06/21/2020  . Essential hypertension   . Mixed hyperlipidemia   . GERD without esophagitis   . Generalized anxiety disorder   . Low back pain 08/14/2007  . ELEVATED BP READING WITHOUT DX HYPERTENSION 08/14/2007  . ROTATOR CUFF SPRAIN AND STRAIN 08/14/2007  . CALLUSES, RIGHT FOOT 04/21/2007  . Pain in limb 04/21/2007    Edward Terry, PT, DPT 08/24/2020, 1:02 PM  [facility] [address] [place], [place], [address] Phone: [phone]   Fax:   [phone]  Name: Edward Terry MRN: [id number] Date of Birth: June 01, 1981

## 2020-08-28 DIAGNOSIS — L299 Pruritus, unspecified: Secondary | ICD-10-CM | POA: Diagnosis not present

## 2020-08-28 DIAGNOSIS — L539 Erythematous condition, unspecified: Secondary | ICD-10-CM | POA: Diagnosis not present

## 2020-08-28 DIAGNOSIS — L739 Follicular disorder, unspecified: Secondary | ICD-10-CM | POA: Diagnosis not present

## 2020-09-05 ENCOUNTER — Other Ambulatory Visit: Payer: Self-pay

## 2020-09-05 ENCOUNTER — Ambulatory Visit: Payer: BLUE CROSS/BLUE SHIELD

## 2020-09-05 DIAGNOSIS — S76301A Unspecified injury of muscle, fascia and tendon of the posterior muscle group at thigh level, right thigh, initial encounter: Secondary | ICD-10-CM

## 2020-09-05 DIAGNOSIS — M545 Low back pain, unspecified: Secondary | ICD-10-CM | POA: Diagnosis not present

## 2020-09-05 DIAGNOSIS — G8929 Other chronic pain: Secondary | ICD-10-CM

## 2020-09-05 DIAGNOSIS — M6281 Muscle weakness (generalized): Secondary | ICD-10-CM | POA: Diagnosis not present

## 2020-09-05 NOTE — Therapy (Addendum)
[place], [place], [address] Phone: [phone]   Fax:  [phone]  Physical Therapy Treatment/Discharge  Patient Details  Name: GEAROLD WAINER MRN: [id number] Date of Birth: 25-Nov-1980 Referring Provider (PT): Reinaldo Meeker, MD   Encounter Date: 09/05/2020   PT End of Session - 09/05/20 1153     Visit Number 5    Number of Visits 6    Date for PT Re-Evaluation 08/29/20    Authorization Type BCBS    PT Start Time 1150    PT Stop Time 4034    PT Time Calculation (min) 40 min    Activity Tolerance Patient tolerated treatment well    Behavior During Therapy [facility] for tasks assessed/performed             Past Medical History:  Diagnosis Date   Essential hypertension    Generalized anxiety disorder    GERD without esophagitis    Hyperlipidemia 01/23/2016   Hypertension 2013   Mixed hyperlipidemia     Past Surgical History:  Procedure Laterality Date   None     UPPER GASTROINTESTINAL ENDOSCOPY     WISDOM TOOTH EXTRACTION      There were no vitals filed for this visit.   Subjective Assessment - 09/05/20 1155     Subjective He reports he has not been diligent with his HEP from being busy. He went back to Taekwondo last night after 2 weeks ago. His hamstring is sore today, he thinks from sitting more at work and in the car. He feels he has what he needs going forward and wants to hold his chart for a month to make sure he doesn't need to come back.    Pertinent History Plantar fasciitis last 2 years B feet    Limitations Sitting;Other (comment)   kicking in Taekwondo   How long can you sit comfortably? 45 min    Patient Stated Goals Figure out how to get rid of the hamstring pain    Currently in Pain? Yes    Pain Score 2     Pain Onset More than a month ago                               [facility] Adult PT Treatment/Exercise - 09/05/20 0001       Lumbar Exercises:  Supine   Bridge with Diona Foley Squeeze 15 reps;5 seconds    Bridge with [employer] Limitations pillow b/t knees      Lumbar Exercises: Sidelying   Other Sidelying Lumbar Exercises side lifts with legs, upper body then both      Lumbar Exercises: Prone   Straight Leg Raise 10 reps;5 seconds   towel roll   Straight Leg Raises Limitations knee bent      Lumbar Exercises: Quadruped   Straight Leg Raise 10 reps;3 seconds    Straight Leg Raises Limitations on airex    Other Quadruped Lumbar Exercises Serratus punches on tall mat table x10   airex under knees                   PT Education - 09/05/20 1259     Education Details reviewed HEP, HEP practice at least 2x/week, even just 5 minutes of a couple of exercises, holding chart for 30 days    Person(s) Educated Patient    Methods Explanation;Demonstration;Verbal cues;Tactile cues    Comprehension Verbalized understanding;Returned demonstration;Verbal cues  required;Tactile cues required              PT Short Term Goals - 08/24/20 1301       PT SHORT TERM GOAL #1   Title Pt will be independent and compliant with initial HEP.    Baseline given at eval    Time 2    Period Weeks    Status Achieved    Target Date 08/01/20      PT SHORT TERM GOAL #2   Title FOTO will taken by visit #3 and long term goal created.    Baseline no FOTO    Time 3    Period Weeks    Status Not Met    Target Date 08/08/20               PT Long Term Goals - 09/05/20 1309       PT LONG TERM GOAL #1   Title Pt will be independent with long term HEP for continued progression and prevention of recurrence.    Baseline knows what to do, inconsistent compliance    Time 6    Period Weeks    Status Achieved      PT LONG TERM GOAL #2   Title Pt will demonstrate symmetrical lumbar AROM.    Time 6    Period Weeks    Status Achieved      PT LONG TERM GOAL #3   Title Pt will report minimal to no pain with daily activity and leisure.     Baseline minimal to no pain    Time 6    Period Weeks    Status Achieved      PT LONG TERM GOAL #4   Title Pt will perform double straight leg lowers to <45, maintaining imprinted lumbar spine, to demonstrate improved core strength.    Baseline 40-45    Time 6    Period Weeks    Status Achieved                   Plan - 09/05/20 1301     Clinical Impression Statement Pt presents with some R hamstring soreness today after not being diligent with home program due to extra stress at work. He has made excellent progress toward improved core strength, body awareness, and body mechanics. He has made the connection to no hamstring pain, as long as he continue to work on HEP for improved core strength. He was educated his chart could remain open for 30 days before discharge, should he need to return. After that point, he would need to obtain a new Rx from his MD.    Personal Factors and Comorbidities Comorbidity 2;Past/Current Experience;Time since onset of injury/illness/exacerbation    Comorbidities HTN, HLD    Examination-Activity Limitations Sit    Examination-Participation Restrictions Driving;Community Activity    Stability/Clinical Decision Making Stable/Uncomplicated    Rehab Potential Good    PT Frequency 1x / week    PT Duration --   4-6 weeks   PT Treatment/Interventions ADLs/Self Care Home Management;Spinal Manipulations;Passive range of motion;Dry needling;Taping;Manual techniques;Patient/family education;Neuromuscular re-education;Therapeutic exercise;Balance training;Functional mobility training;Therapeutic activities;Moist Heat;Vasopneumatic Device;Electrical Stimulation;Cryotherapy    PT Next Visit Plan Hold chart for 30 d until [place] [id number]    Consulted and Agree with Plan of Care Patient             Patient will benefit from skilled therapeutic intervention in order to improve the following deficits  and impairments:  Pain,Increased  fascial restricitons,Decreased strength,Decreased range of motion  Visit Diagnosis: Right hamstring injury, initial encounter  Chronic bilateral low back pain without sciatica  Muscle weakness (generalized)     Problem List Patient Active Problem List   Diagnosis Date Noted   Folliculitis 41/63/8453   Atopic dermatitis 07/26/2020   Routine general medical examination at a health care facility 06/21/2020   Essential hypertension    Mixed hyperlipidemia    GERD without esophagitis    Generalized anxiety disorder    Low back pain 08/14/2007   ELEVATED BP READING WITHOUT DX HYPERTENSION 08/14/2007   ROTATOR CUFF SPRAIN AND STRAIN 08/14/2007   CALLUSES, RIGHT FOOT 04/21/2007   Pain in limb 04/21/2007    Izell Lyman, PT, DPT 09/05/2020, 1:11 PM  PHYSICAL THERAPY DISCHARGE SUMMARY  Visits from Start of Care: 5  Current functional level related to goals / functional outcomes: unknown   Remaining deficits: unknown   Education / Equipment: N/a   Patient agrees to discharge. Patient goals were partially met. Patient is being discharged due to not returning since the last visit.  Gwendolyn Grant, PT, DPT, ATC 05/29/21 1:35 PM  [facility] [address] [place], [place], [address] Phone: [phone]   Fax:  [phone]  Name: DELANO FRATE MRN: [id number] Date of Birth: 13-May-1981

## 2020-09-12 ENCOUNTER — Ambulatory Visit: Payer: BLUE CROSS/BLUE SHIELD

## 2020-10-27 ENCOUNTER — Other Ambulatory Visit: Payer: Self-pay | Admitting: Cardiology

## 2020-10-27 DIAGNOSIS — L308 Other specified dermatitis: Secondary | ICD-10-CM | POA: Diagnosis not present

## 2020-12-02 ENCOUNTER — Other Ambulatory Visit: Payer: Self-pay | Admitting: Legal Medicine

## 2020-12-02 DIAGNOSIS — F411 Generalized anxiety disorder: Secondary | ICD-10-CM

## 2021-02-23 ENCOUNTER — Other Ambulatory Visit: Payer: Self-pay | Admitting: Legal Medicine

## 2021-02-23 DIAGNOSIS — E782 Mixed hyperlipidemia: Secondary | ICD-10-CM

## 2021-03-14 ENCOUNTER — Other Ambulatory Visit: Payer: Self-pay | Admitting: Cardiology

## 2021-04-26 DIAGNOSIS — R051 Acute cough: Secondary | ICD-10-CM | POA: Diagnosis not present

## 2021-04-26 DIAGNOSIS — Z20822 Contact with and (suspected) exposure to covid-19: Secondary | ICD-10-CM | POA: Diagnosis not present

## 2021-04-26 DIAGNOSIS — J029 Acute pharyngitis, unspecified: Secondary | ICD-10-CM | POA: Diagnosis not present

## 2021-04-26 DIAGNOSIS — R0981 Nasal congestion: Secondary | ICD-10-CM | POA: Diagnosis not present

## 2021-04-28 ENCOUNTER — Other Ambulatory Visit: Payer: Self-pay | Admitting: Gastroenterology

## 2021-04-28 ENCOUNTER — Other Ambulatory Visit: Payer: Self-pay | Admitting: Cardiology

## 2021-04-30 NOTE — Telephone Encounter (Signed)
Rx(s) sent to pharmacy electronically.  

## 2021-05-02 ENCOUNTER — Ambulatory Visit: Payer: BLUE CROSS/BLUE SHIELD | Admitting: Legal Medicine

## 2021-05-02 ENCOUNTER — Encounter: Payer: Self-pay | Admitting: Legal Medicine

## 2021-05-02 VITALS — BP 140/80 | HR 72 | Temp 98.2°F | Resp 17 | Ht 76.0 in | Wt 233.0 lb

## 2021-05-02 DIAGNOSIS — J180 Bronchopneumonia, unspecified organism: Secondary | ICD-10-CM

## 2021-05-02 DIAGNOSIS — Z9189 Other specified personal risk factors, not elsewhere classified: Secondary | ICD-10-CM | POA: Diagnosis not present

## 2021-05-02 DIAGNOSIS — R6889 Other general symptoms and signs: Secondary | ICD-10-CM | POA: Diagnosis not present

## 2021-05-02 LAB — POCT INFLUENZA A/B
Influenza A, POC: NEGATIVE
Influenza B, POC: NEGATIVE

## 2021-05-02 LAB — POC COVID19 BINAXNOW: SARS Coronavirus 2 Ag: NEGATIVE

## 2021-05-02 MED ORDER — CHERATUSSIN AC 100-10 MG/5ML PO SOLN
5.0000 mL | Freq: Three times a day (TID) | ORAL | 0 refills | Status: DC | PRN
Start: 2021-05-02 — End: 2021-06-07

## 2021-05-02 MED ORDER — AMOXICILLIN-POT CLAVULANATE 875-125 MG PO TABS: 1.0000 | ORAL_TABLET | Freq: Two times a day (BID) | ORAL | 0 refills | Status: DC

## 2021-05-02 MED ORDER — PREDNISONE 10 MG (21) PO TBPK: ORAL_TABLET | ORAL | 0 refills | Status: DC

## 2021-05-02 NOTE — Progress Notes (Signed)
Acute Office Visit  Subjective:    Patient ID: Edward Terry, male    DOB: [birth year], 40 y.o.   MRN: [id number]  Chief Complaint  Patient presents with   Cough   Sinusitis    HPI: Patient is in today for nasal congestion, chest congestion, cough since 04/16/2021. He mentioned that his family has flu in that time. He got worse on 04/20/2021. Patient went to Urgent care in [facility] on 04/26/2021 and they checked for covid, flu and strep and they were negative. They diagnosed him with URI, but they did not give any medication.   Past Medical History:  Diagnosis Date   Essential hypertension    Generalized anxiety disorder    GERD without esophagitis    Hyperlipidemia 01/23/2016   Hypertension 2013   Mixed hyperlipidemia     Past Surgical History:  Procedure Laterality Date   None     UPPER GASTROINTESTINAL ENDOSCOPY     WISDOM TOOTH EXTRACTION      Family History  Problem Relation Age of Onset   Breast cancer Mother    Healthy Father    Heart attack Maternal Grandfather    Dementia Paternal Grandmother    Other Paternal Grandfather        suicide   Pancreatic disease Other        great aunt   Edward cancer Neg Hx    Rectal cancer Neg Hx    Esophageal cancer Neg Hx    Liver cancer Neg Hx    Prostate cancer Neg Hx     Social History   Socioeconomic History   Marital status: Married    Spouse name: Not on file   Number of children: 2   Years of education: Not on file   Highest education level: Not on file  Occupational History   Occupation: Sales  Tobacco Use   Smoking status: Former    Types: Cigarettes   Smokeless tobacco: Former    Types: Snuff    Quit date: 2018   Tobacco comments:    during college  Vaping Use   Vaping Use: Never used  Substance and Sexual Activity   Alcohol use: Yes    Alcohol/week: 4.0 standard drinks    Types: 4 Cans of beer per week    Comment: 4-5 cans daily   Drug use: No   Sexual activity: Yes    Partners:  Female  Other Topics Concern   Not on file  Social History Narrative   Not on file   Social Determinants of Health   Financial Resource Strain: Not on file  Food Insecurity: Not on file  Transportation Needs: Not on file  Physical Activity: Not on file  Stress: Not on file  Social Connections: Not on file  Intimate Partner Violence: Not on file    Outpatient Medications Prior to Visit  Medication Sig Dispense Refill   atorvastatin (LIPITOR) 10 MG tablet TAKE 1 TABLET BY MOUTH EVERY DAY 30 tablet 8   chlorthalidone (HYGROTON) 25 MG tablet TAKE 1 TABLET BY MOUTH EVERY DAY 90 tablet 1   clonazePAM (KLONOPIN) 0.5 MG tablet TAKE 1 TABLET BY MOUTH EVERY DAY AS NEEDED FOR ANXIETY 30 tablet 3   famotidine (PEPCID) 20 MG tablet Take 1 tablet (20 mg total) by mouth at bedtime. 90 tablet 3   lisinopril (ZESTRIL) 10 MG tablet TAKE 1 TABLET BY MOUTH EVERY DAY 90 tablet 3   omeprazole (PRILOSEC) 40 MG capsule TAKE 1 CAPSULE BY  MOUTH EVERY DAY 90 capsule 1   cephALEXin (KEFLEX) 500 MG capsule Take 1 capsule (500 mg total) by mouth 4 (four) times daily. 20 capsule 0   No facility-administered medications prior to visit.    Allergies  Allergen Reactions   Solarcaine Aloe Extra [Lidocaine] Other (See Comments)    blistering    Review of Systems  Constitutional:  Negative for chills, fatigue and fever.  HENT:  Positive for congestion. Negative for ear pain, sinus pain and sore throat.   Respiratory:  Positive for cough. Negative for shortness of breath.   Cardiovascular:  Negative for chest pain.  Musculoskeletal:  Negative for myalgias.  Neurological:  Negative for headaches.      Objective:    Physical Exam Vitals reviewed.  Constitutional:      General: He is not in acute distress.    Appearance: Normal appearance.  HENT:     Head: Normocephalic.     Right Ear: Tympanic membrane, ear canal and external ear normal.     Left Ear: Tympanic membrane, ear canal and external ear  normal.     Nose: Nose normal.     Mouth/Throat:     Mouth: Mucous membranes are moist.     Pharynx: Oropharynx is clear.  Eyes:     Extraocular Movements: Extraocular movements intact.     Conjunctiva/sclera: Conjunctivae normal.     Pupils: Pupils are equal, round, and reactive to light.  Cardiovascular:     Rate and Rhythm: Normal rate and regular rhythm.     Pulses: Normal pulses.     Heart sounds: Normal heart sounds. No murmur heard.   No gallop.  Pulmonary:     Effort: Pulmonary effort is normal. No respiratory distress.     Breath sounds: Rhonchi present. No wheezing.  Abdominal:     General: Abdomen is flat. There is no distension.     Tenderness: There is no abdominal tenderness.  Musculoskeletal:        General: Normal range of motion.     Cervical back: Normal range of motion.  Skin:    General: Skin is warm.     Capillary Refill: Capillary refill takes less than 2 seconds.  Neurological:     General: No focal deficit present.     Mental Status: He is alert and oriented to person, place, and time.    BP 140/80   Pulse 72   Temp 98.2 F (36.8 C)   Resp 17   Ht 6\' 4"  (1.93 m)   Wt 233 lb (105.7 kg)   SpO2 97%   BMI 28.36 kg/m  Wt Readings from Last 3 Encounters:  05/02/21 233 lb (105.7 kg)  07/26/20 227 lb 3.2 oz (103.1 kg)  06/21/20 222 lb 3.2 oz (100.8 kg)    Health Maintenance Due  Topic Date Due   Pneumococcal Vaccine 74-53 Years old (1 - PCV) Never done   HIV Screening  Never done   Hepatitis C Screening  Never done   COVID-19 Vaccine (4 - Booster for Pfizer series) 08/07/2020   INFLUENZA VACCINE  01/22/2021    There are no preventive care reminders to display for this patient.   No results found for: TSH Lab Results  Component Value Date   WBC 7.0 06/21/2020   HGB 14.9 06/21/2020   HCT 44.7 06/21/2020   MCV 91 06/21/2020   PLT 276 06/21/2020   Lab Results  Component Value Date   NA 133 (L) 06/21/2020  K 4.8 06/21/2020   CO2 28  06/21/2020   GLUCOSE 99 06/21/2020   BUN 14 06/21/2020   CREATININE 1.09 06/21/2020   BILITOT 0.7 06/21/2020   ALKPHOS 59 06/21/2020   AST 29 06/21/2020   ALT 31 06/21/2020   PROT 7.0 06/21/2020   ALBUMIN 4.7 06/21/2020   CALCIUM 10.1 06/21/2020   Lab Results  Component Value Date   CHOL 199 06/21/2020   Lab Results  Component Value Date   HDL 57 06/21/2020   Lab Results  Component Value Date   LDLCALC 125 (H) 06/21/2020   Lab Results  Component Value Date   TRIG 92 06/21/2020   Lab Results  Component Value Date   CHOLHDL 3.5 06/21/2020   No results found for: HGBA1C     Assessment & Plan:   Problem List Items Addressed This Visit       Respiratory   Bronchopneumonia   Relevant Medications   amoxicillin-clavulanate (AUGMENTIN) 875-125 MG tablet   predniSONE (STERAPRED UNI-PAK 21 TAB) 10 MG (21) TBPK tablet   guaiFENesin-codeine (CHERATUSSIN AC) 100-10 MG/5ML syrup Patient has a secondary bronchopneumonia   Other Visit Diagnoses     At increased risk of exposure to COVID-19 virus    -  Primary   Relevant Orders   POC COVID-19 (Completed)   Influenza A/B (Completed) Negative Covid test   Flu-like symptoms       Relevant Orders   Influenza A/B (Completed) Negative influenza test      Meds ordered this encounter  Medications   amoxicillin-clavulanate (AUGMENTIN) 875-125 MG tablet    Sig: Take 1 tablet by mouth 2 (two) times daily.    Dispense:  20 tablet    Refill:  0   predniSONE (STERAPRED UNI-PAK 21 TAB) 10 MG (21) TBPK tablet    Sig: Take 6ills first day , then 5 pills day 2 and then cut down one pill day until gone    Dispense:  21 tablet    Refill:  0   guaiFENesin-codeine (CHERATUSSIN AC) 100-10 MG/5ML syrup    Sig: Take 5 mLs by mouth 3 (three) times daily as needed.    Dispense:  120 mL    Refill:  0     Orders Placed This Encounter  Procedures   POC COVID-19   Influenza A/B     Follow-up: Return if symptoms worsen or fail to  improve.  An After Visit Summary was printed and given to the patient.  Brent Bulla, MD [facility] [phone]

## 2021-05-29 DIAGNOSIS — L308 Other specified dermatitis: Secondary | ICD-10-CM | POA: Diagnosis not present

## 2021-06-07 ENCOUNTER — Other Ambulatory Visit: Payer: Self-pay

## 2021-06-07 ENCOUNTER — Encounter: Payer: Self-pay | Admitting: Legal Medicine

## 2021-06-07 ENCOUNTER — Ambulatory Visit (INDEPENDENT_AMBULATORY_CARE_PROVIDER_SITE_OTHER): Payer: BLUE CROSS/BLUE SHIELD | Admitting: Legal Medicine

## 2021-06-07 VITALS — BP 130/80 | HR 72 | Temp 97.2°F | Ht 77.0 in | Wt 232.0 lb

## 2021-06-07 DIAGNOSIS — E782 Mixed hyperlipidemia: Secondary | ICD-10-CM | POA: Diagnosis not present

## 2021-06-07 DIAGNOSIS — Z0000 Encounter for general adult medical examination without abnormal findings: Secondary | ICD-10-CM

## 2021-06-07 DIAGNOSIS — F411 Generalized anxiety disorder: Secondary | ICD-10-CM

## 2021-06-07 DIAGNOSIS — Z23 Encounter for immunization: Secondary | ICD-10-CM

## 2021-06-07 DIAGNOSIS — I10 Essential (primary) hypertension: Secondary | ICD-10-CM | POA: Diagnosis not present

## 2021-06-07 MED ORDER — ALBUTEROL SULFATE HFA 108 (90 BASE) MCG/ACT IN AERS: 2.0000 | INHALATION_SPRAY | Freq: Four times a day (QID) | RESPIRATORY_TRACT | 6 refills | Status: DC | PRN

## 2021-06-07 NOTE — Progress Notes (Signed)
Subjective:  Patient ID: Edward Terry, male    DOB: 12-26-1980  Age: 40 y.o. MRN: [id number]  Chief Complaint  Patient presents with   Annual Exam    HPI Encounter for general adult medical examination without abnormal findings  Physical ("At Risk" items are starred): Patient's last physical exam was 1 year ago .   Growth percentile SmartLinks can only be used for patients less than 31 years old.   SDOH Screenings   Alcohol Screen: Not on file  Depression (PHQ2-9): Low Risk    PHQ-2 Score: 0  Financial Resource Strain: Not on file  Food Insecurity: Not on file  Housing: Not on file  Physical Activity: Not on file  Social Connections: Not on file  Stress: Not on file  Tobacco Use: Medium Risk   Smoking Tobacco Use: Former   Smokeless Tobacco Use: Former   Passive Exposure: Not on file  Transportation Needs: Not on file    Fall Risk  12/15/2019  Falls in the past year? 0  Number falls in past yr: 0  Injury with Fall? 0  Follow up Falls evaluation completed    Depression screen [facility] 2/9 05/02/2021 12/15/2019  Decreased Interest 0 0  Down, Depressed, Hopeless 0 0  PHQ - 2 Score 0 0    Functional Status Survey:     Safety: reviewed ;  Patient wears a seat belt. Patient's home has smoke detectors and carbon monoxide detectors. Patient practices appropriate gun safety Patient wears sunscreen with extended sun exposure. Dental Care: biannual cleanings, brushes and flosses daily. Ophthalmology/Optometry: Annual visit.  Hearing loss: none Vision impairments: none Patient is not afflicted from Stress Incontinence and Urge Incontinence   Current Outpatient Medications on File Prior to Visit  Medication Sig Dispense Refill   atorvastatin (LIPITOR) 10 MG tablet TAKE 1 TABLET BY MOUTH EVERY DAY 30 tablet 8   chlorthalidone (HYGROTON) 25 MG tablet TAKE 1 TABLET BY MOUTH EVERY DAY 90 tablet 1   famotidine (PEPCID) 20 MG tablet Take 1 tablet (20 mg total) by mouth at  bedtime. 90 tablet 3   lisinopril (ZESTRIL) 10 MG tablet TAKE 1 TABLET BY MOUTH EVERY DAY 90 tablet 3   omeprazole (PRILOSEC) 40 MG capsule TAKE 1 CAPSULE BY MOUTH EVERY DAY 90 capsule 1   No current facility-administered medications on file prior to visit.    Social Hx   Social History   Socioeconomic History   Marital status: Married    Spouse name: Not on file   Number of children: 2   Years of education: Not on file   Highest education level: Not on file  Occupational History   Occupation: Sales  Tobacco Use   Smoking status: Former    Types: Cigarettes   Smokeless tobacco: Former    Types: Snuff    Quit date: 2018   Tobacco comments:    during college  Vaping Use   Vaping Use: Never used  Substance and Sexual Activity   Alcohol use: Yes    Alcohol/week: 4.0 standard drinks    Types: 4 Cans of beer per week    Comment: 4-5 cans daily   Drug use: No   Sexual activity: Yes    Partners: Female  Other Topics Concern   Not on file  Social History Narrative   Not on file   Social Determinants of Health   Financial Resource Strain: Not on file  Food Insecurity: Not on file  Transportation Needs: Not on file  Physical Activity: Not on file  Stress: Not on file  Social Connections: Not on file   Past Medical History:  Diagnosis Date   Essential hypertension    Generalized anxiety disorder    GERD without esophagitis    Hyperlipidemia 01/23/2016   Hypertension 2013   Mixed hyperlipidemia    Family History  Problem Relation Age of Onset   Breast cancer Mother    Healthy Father    Heart attack Maternal Grandfather    Dementia Paternal Grandmother    Other Paternal Grandfather        suicide   Pancreatic disease Other        great aunt   Colon cancer Neg Hx    Rectal cancer Neg Hx    Esophageal cancer Neg Hx    Liver cancer Neg Hx    Prostate cancer Neg Hx     Review of Systems   Objective:  There were no vitals taken for this visit.  BP/Weight  05/02/2021 07/26/2020 06/21/2020  Systolic BP 140 132 122  Diastolic BP 80 84 80  Wt. (Lbs) 233 227.2 222.2  BMI 28.36 27.66 27.05    Physical Exam  Lab Results  Component Value Date   WBC 7.0 06/21/2020   HGB 14.9 06/21/2020   HCT 44.7 06/21/2020   PLT 276 06/21/2020   GLUCOSE 99 06/21/2020   CHOL 199 06/21/2020   TRIG 92 06/21/2020   HDL 57 06/21/2020   LDLCALC 125 (H) 06/21/2020   ALT 31 06/21/2020   AST 29 06/21/2020   NA 133 (L) 06/21/2020   K 4.8 06/21/2020   CL 92 (L) 06/21/2020   CREATININE 1.09 06/21/2020   BUN 14 06/21/2020   CO2 28 06/21/2020      Assessment & Plan:  1. Essential hypertension  2. Mixed hyperlipidemia  3. Generalized anxiety disorder  4. Routine general medical examination at a health care facility    Meds ordered this encounter  Medications   albuterol (VENTOLIN HFA) 108 (90 Base) MCG/ACT inhaler    Sig: Inhale 2 puffs into the lungs every 6 (six) hours as needed.    Dispense:  8 g    Refill:  6    These are the goals we discussed:  Goals      Cut out extra servings         This is a list of the screening recommended for you and due dates:  Health Maintenance  Topic Date Due   Pneumococcal Vaccination (1 - PCV) Never done   HIV Screening  Never done   Hepatitis C Screening: USPSTF Recommendation to screen - Ages 68-79 yo.  Never done   COVID-19 Vaccine (4 - Booster for [employer] series) 08/07/2020   Flu Shot  01/22/2021   Tetanus Vaccine  04/14/2024   HPV Vaccine  Aged Out      AN INDIVIDUALIZED CARE PLAN: was established or reinforced today.   SELF MANAGEMENT: The patient and I together assessed ways to personally work towards obtaining the recommended goals  Support needs The patient and/or family needs were assessed and services were offered and not necessary at this time.    Follow-up: No follow-ups on file. Horald Pollen [facility] [phone]

## 2021-06-07 NOTE — Progress Notes (Signed)
Subjective:  Patient ID: Edward Terry, male    DOB: [birth year]  Age: 40 y.o. MRN: [id number]  Chief Complaint  Patient presents with   Annual Exam     HPI  Well Adult Physical: Patient here for a comprehensive physical exam.The patient reports problems -   hypertension, hypercholesterolemia, insomnia. Do you take any herbs or supplements that were not prescribed by a doctor? no Are you taking calcium supplements? no Are you taking aspirin daily? 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  Encounter for general adult medical examination without abnormal findings  Physical ("At Risk" items are starred): Patient's last physical exam was 1 year ago .  Patient wears a seat belt, has smoke detectors, has carbon monoxide detectors, practices appropriate gun safety, and wears sunscreen with extended sun exposure. Dental Care: biannual cleanings, brushes and flosses daily. Ophthalmology/Optometry: Annual visit.  Hearing loss: none Vision impairments: none Last PSA:0.5  Flowsheet Row Office Visit from 06/07/2021 in [facility]  PHQ-2 Total Score 0               Social History   Socioeconomic History   Marital status: Married    Spouse name: Not on file   Number of children: 2   Years of education: Not on file   Highest education level: Not on file  Occupational History   Occupation: Sales  Tobacco Use   Smoking status: Former    Types: Cigarettes   Smokeless tobacco: Former    Types: Snuff    Quit date: 2018   Tobacco comments:    during college  Vaping Use   Vaping Use: Never used  Substance and Sexual Activity   Alcohol use: Yes    Alcohol/week: 4.0 standard drinks    Types: 4 Cans of beer per week    Comment: 4-5 cans daily   Drug use: No   Sexual activity: Yes    Partners: Female  Other Topics Concern   Not on file  Social History Narrative   Not on file   Social Determinants of Health   Financial Resource Strain: Not on file   Food Insecurity: Not on file  Transportation Needs: Not on file  Physical Activity: Not on file  Stress: Not on file  Social Connections: Not on file   Past Medical History:  Diagnosis Date   Essential hypertension    Generalized anxiety disorder    GERD without esophagitis    Hyperlipidemia 01/23/2016   Hypertension 2013   Mixed hyperlipidemia    Past Surgical History:  Procedure Laterality Date   None     UPPER GASTROINTESTINAL ENDOSCOPY     WISDOM TOOTH EXTRACTION      Family History  Problem Relation Age of Onset   Breast cancer Mother    Healthy Father    Heart attack Maternal Grandfather    Dementia Paternal Grandmother    Other Paternal Grandfather        suicide   Pancreatic disease Other        great aunt   Colon cancer Neg Hx    Rectal cancer Neg Hx    Esophageal cancer Neg Hx    Liver cancer Neg Hx    Prostate cancer Neg Hx    Social History   Socioeconomic History   Marital status: Married    Spouse name: Not on file   Number of children: 2   Years of education: Not on file   Highest education  level: Not on file  Occupational History   Occupation: Sales  Tobacco Use   Smoking status: Former    Types: Cigarettes   Smokeless tobacco: Former    Types: Snuff    Quit date: 2018   Tobacco comments:    during college  Vaping Use   Vaping Use: Never used  Substance and Sexual Activity   Alcohol use: Yes    Alcohol/week: 4.0 standard drinks    Types: 4 Cans of beer per week    Comment: 4-5 cans daily   Drug use: No   Sexual activity: Yes    Partners: Female  Other Topics Concern   Not on file  Social History Narrative   Not on file   Social Determinants of Health   Financial Resource Strain: Not on file  Food Insecurity: Not on file  Transportation Needs: Not on file  Physical Activity: Not on file  Stress: Not on file  Social Connections: Not on file   Review of Systems  Constitutional:  Negative for activity change and appetite  change.  HENT:  Negative for congestion.   Eyes:  Negative for visual disturbance.  Respiratory:  Negative for chest tightness and shortness of breath.   Cardiovascular:  Negative for chest pain, palpitations and leg swelling.  Gastrointestinal:  Negative for abdominal distention and abdominal pain.  Genitourinary:  Negative for difficulty urinating and dysuria.  Musculoskeletal:  Negative for arthralgias and back pain.  Skin: Negative.   Neurological: Negative.   Psychiatric/Behavioral: Negative.      Objective:  BP 130/80    Pulse 72    Temp (!) 97.2 F (36.2 C)    Ht 6\' 5"  (1.956 m)    Wt 232 lb (105.2 kg)    SpO2 98%    BMI 27.51 kg/m   BP/Weight 06/07/2021 [id number]  Systolic BP [id number]  Diastolic BP 80 80 84  Wt. (Lbs) 232 233 227.2  BMI 27.51 28.36 27.66    Physical Exam Vitals reviewed.  Constitutional:      General: He is not in acute distress.    Appearance: Normal appearance.  HENT:     Right Ear: Tympanic membrane normal.     Left Ear: Tympanic membrane normal.     Nose: Nose normal.     Mouth/Throat:     Mouth: Mucous membranes are dry.     Pharynx: Oropharynx is clear.  Eyes:     Extraocular Movements: Extraocular movements intact.     Conjunctiva/sclera: Conjunctivae normal.     Pupils: Pupils are equal, round, and reactive to light.  Cardiovascular:     Rate and Rhythm: Normal rate and regular rhythm.     Pulses: Normal pulses.     Heart sounds: Normal heart sounds. No murmur heard.   No gallop.  Pulmonary:     Effort: Pulmonary effort is normal. No respiratory distress.     Breath sounds: Normal breath sounds. No wheezing.  Abdominal:     General: Abdomen is flat. Bowel sounds are normal. There is no distension.     Palpations: Abdomen is soft.     Tenderness: There is no abdominal tenderness.  Genitourinary:    Prostate: Normal.     Rectum: Normal.  Musculoskeletal:        General: Normal range of motion.     Cervical back:  Normal range of motion and neck supple.     Right lower leg: No edema.     Left lower  leg: No edema.  Skin:    General: Skin is warm.     Capillary Refill: Capillary refill takes less than 2 seconds.  Neurological:     General: No focal deficit present.     Mental Status: He is alert and oriented to person, place, and time. Mental status is at baseline.     Gait: Gait normal.     Deep Tendon Reflexes: Reflexes normal.  Psychiatric:        Mood and Affect: Mood normal.        Thought Content: Thought content normal.        Judgment: Judgment normal.    Lab Results  Component Value Date   WBC 7.0 06/21/2020   HGB 14.9 06/21/2020   HCT 44.7 06/21/2020   PLT 276 06/21/2020   GLUCOSE 99 06/21/2020   CHOL 199 06/21/2020   TRIG 92 06/21/2020   HDL 57 06/21/2020   LDLCALC 125 (H) 06/21/2020   ALT 31 06/21/2020   AST 29 06/21/2020   NA 133 (L) 06/21/2020   K 4.8 06/21/2020   CL 92 (L) 06/21/2020   CREATININE 1.09 06/21/2020   BUN 14 06/21/2020   CO2 28 06/21/2020      Assessment & Plan:   Problem List Items Addressed This Visit       Cardiovascular and Mediastinum Routine general medical examination at a health care facility  Relevant Orders  PSA Patient presents for routine exam      Essential hypertension - Primary   Relevant Orders   CBC with Differential/Platelet   Comprehensive metabolic panel An individual hypertension care plan was established and reinforced today.  The patient's status was assessed using clinical findings on exam and labs or diagnostic tests. The patient's success at meeting treatment goals on disease specific evidence-based guidelines and found to be well controlled. SELF MANAGEMENT: The patient and I together assessed ways to personally work towards obtaining the recommended goals. RECOMMENDATIONS: avoid decongestants found in common cold remedies, decrease consumption of alcohol, perform routine monitoring of BP with home BP cuff, exercise,  reduction of dietary salt, take medicines as prescribed, try not to miss doses and quit smoking.  Regular exercise and maintaining a healthy weight is needed.  Stress reduction may help. A CLINICAL SUMMARY including written plan identify barriers to care unique to individual due to social or financial issues.  We attempt to mutually creat solutions for individual and family understanding.      Other   Mixed hyperlipidemia   Relevant Orders   Lipid panel AN INDIVIDUAL CARE PLAN for hyperlipidemia/ cholesterol was established and reinforced today.  The patient's status was assessed using clinical findings on exam, lab and other diagnostic tests. The patient's disease status was assessed based on evidence-based guidelines and found to be well controlled. MEDICATIONS were reviewed. SELF MANAGEMENT GOALS have been discussed and patient's success at attaining the goal of low cholesterol was assessed. RECOMMENDATION given include regular exercise 3 days a week and low cholesterol/low fat diet. CLINICAL SUMMARY including written plan to identify barriers unique to the patient due to social or economic  reasons was discussed.     Generalized anxiety disorder Patient has GAD and is stable             Other Visit Diagnoses     Need for immunization against influenza       Relevant Orders   Flu Vaccine QUAD 36mo+IM (Fluarix, Fluzone & Alfiuria Quad PF) (Completed)  Body mass index is 27.51 kg/m.   These are the goals we discussed:  Goals      Cut out extra servings     DIET - REDUCE FAT INTAKE         This is a list of the screening recommended for you and due dates:  Health Maintenance  Topic Date Due   Pneumococcal Vaccination (1 - PCV) Never done   COVID-19 Vaccine (4 - Booster for Pfizer series) 08/07/2020   Tetanus Vaccine  04/14/2024   Flu Shot  Completed   HPV Vaccine  Aged Out   Hepatitis C Screening: USPSTF Recommendation to screen - Ages 18-79 yo.  Discontinued    HIV Screening  Discontinued     Meds ordered this encounter  Medications   DISCONTD: albuterol (VENTOLIN HFA) 108 (90 Base) MCG/ACT inhaler    Sig: Inhale 2 puffs into the lungs every 6 (six) hours as needed.    Dispense:  8 g    Refill:  6      Follow-up: No follow-ups on file.  An After Visit Summary was printed and given to the patient.  Reinaldo Meeker, MD [facility] [phone]

## 2021-06-08 LAB — COMPREHENSIVE METABOLIC PANEL
ALT: 27 IU/L (ref 0–44)
AST: 29 IU/L (ref 0–40)
Albumin/Globulin Ratio: 2 (ref 1.2–2.2)
Albumin: 5.1 g/dL — ABNORMAL HIGH (ref 4.0–5.0)
Alkaline Phosphatase: 64 IU/L (ref 44–121)
BUN/Creatinine Ratio: 11 (ref 9–20)
BUN: 11 mg/dL (ref 6–24)
Bilirubin Total: 0.7 mg/dL (ref 0.0–1.2)
CO2: 30 mmol/L — ABNORMAL HIGH (ref 20–29)
Calcium: 10.2 mg/dL (ref 8.7–10.2)
Chloride: 91 mmol/L — ABNORMAL LOW (ref 96–106)
Creatinine, Ser: 1 mg/dL (ref 0.76–1.27)
Globulin, Total: 2.5 g/dL (ref 1.5–4.5)
Glucose: 98 mg/dL (ref 70–99)
Potassium: 4.5 mmol/L (ref 3.5–5.2)
Sodium: 135 mmol/L (ref 134–144)
Total Protein: 7.6 g/dL (ref 6.0–8.5)
eGFR: 96 mL/min/{1.73_m2} (ref >59)

## 2021-06-08 LAB — CBC WITH DIFFERENTIAL/PLATELET
Basophils Absolute: 0 10*3/uL (ref 0.0–0.2)
Basos: 1 %
EOS (ABSOLUTE): 0.2 10*3/uL (ref 0.0–0.4)
Eos: 3 %
Hematocrit: 46.3 % (ref 37.5–51.0)
Hemoglobin: 15.5 g/dL (ref 13.0–17.7)
Immature Grans (Abs): 0 10*3/uL (ref 0.0–0.1)
Immature Granulocytes: 0 %
Lymphocytes Absolute: 2 10*3/uL (ref 0.7–3.1)
Lymphs: 37 %
MCH: 30.9 pg (ref 26.6–33.0)
MCHC: 33.5 g/dL (ref 31.5–35.7)
MCV: 92 fL (ref 79–97)
Monocytes Absolute: 0.6 10*3/uL (ref 0.1–0.9)
Monocytes: 11 %
Neutrophils Absolute: 2.6 10*3/uL (ref 1.4–7.0)
Neutrophils: 48 %
Platelets: 307 10*3/uL (ref 150–450)
RBC: 5.02 x10E6/uL (ref 4.14–5.80)
RDW: 12.7 % (ref 11.6–15.4)
WBC: 5.5 10*3/uL (ref 3.4–10.8)

## 2021-06-08 LAB — LIPID PANEL
Chol/HDL Ratio: 3.2 ratio (ref 0.0–5.0)
Cholesterol, Total: 206 mg/dL — ABNORMAL HIGH (ref 100–199)
HDL: 64 mg/dL (ref >39)
LDL Chol Calc (NIH): 125 mg/dL — ABNORMAL HIGH (ref 0–99)
Triglycerides: 95 mg/dL (ref 0–149)
VLDL Cholesterol Cal: 17 mg/dL (ref 5–40)

## 2021-06-08 LAB — CARDIOVASCULAR RISK ASSESSMENT

## 2021-06-08 LAB — PSA: Prostate Specific Ag, Serum: 0.5 ng/mL (ref 0.0–4.0)

## 2021-06-08 NOTE — Progress Notes (Signed)
CBC normal, kidney and liver tests normal, LDL cholesterol high 125 is he taking atorvastatin? May need to increase or change statin, psa 0.5 normal lp

## 2021-06-09 ENCOUNTER — Other Ambulatory Visit: Payer: Self-pay | Admitting: Gastroenterology

## 2021-06-11 NOTE — Progress Notes (Signed)
Patient called.  Patient aware. Pt stated he is taking the atorvastatin as directed but he has not been eating healthy. Pt stated that he will try to work on eating better.

## 2021-06-25 ENCOUNTER — Other Ambulatory Visit: Payer: Self-pay | Admitting: Legal Medicine

## 2021-06-25 DIAGNOSIS — F411 Generalized anxiety disorder: Secondary | ICD-10-CM

## 2021-08-17 DIAGNOSIS — M25361 Other instability, right knee: Secondary | ICD-10-CM | POA: Diagnosis not present

## 2021-08-17 DIAGNOSIS — M25561 Pain in right knee: Secondary | ICD-10-CM | POA: Diagnosis not present

## 2021-10-18 ENCOUNTER — Other Ambulatory Visit: Payer: Self-pay | Admitting: Cardiology

## 2021-10-18 DIAGNOSIS — M25561 Pain in right knee: Secondary | ICD-10-CM | POA: Diagnosis not present

## 2021-10-18 DIAGNOSIS — M2351 Chronic instability of knee, right knee: Secondary | ICD-10-CM | POA: Diagnosis not present

## 2021-10-18 NOTE — Telephone Encounter (Signed)
Rx(s) sent to pharmacy electronically.  

## 2021-10-19 ENCOUNTER — Other Ambulatory Visit: Payer: Self-pay | Admitting: Orthopedic Surgery

## 2021-10-19 DIAGNOSIS — M25561 Pain in right knee: Secondary | ICD-10-CM

## 2021-10-24 ENCOUNTER — Other Ambulatory Visit: Payer: Self-pay | Admitting: Gastroenterology

## 2021-10-28 ENCOUNTER — Ambulatory Visit
Admission: RE | Admit: 2021-10-28 | Discharge: 2021-10-28 | Disposition: A | Discharge status: Home / Self Care | Payer: BLUE CROSS/BLUE SHIELD | Source: Ambulatory Visit | Attending: Orthopedic Surgery | Admitting: Orthopedic Surgery

## 2021-10-28 DIAGNOSIS — M25561 Pain in right knee: Secondary | ICD-10-CM

## 2021-10-28 DIAGNOSIS — M1711 Unilateral primary osteoarthritis, right knee: Secondary | ICD-10-CM | POA: Diagnosis not present

## 2021-10-28 DIAGNOSIS — M25461 Effusion, right knee: Secondary | ICD-10-CM | POA: Diagnosis not present

## 2021-10-28 DIAGNOSIS — M2241 Chondromalacia patellae, right knee: Secondary | ICD-10-CM | POA: Diagnosis not present

## 2021-10-28 DIAGNOSIS — S83241A Other tear of medial meniscus, current injury, right knee, initial encounter: Secondary | ICD-10-CM | POA: Diagnosis not present

## 2021-10-29 ENCOUNTER — Other Ambulatory Visit: Payer: Self-pay

## 2021-10-29 MED ORDER — CHLORTHALIDONE 25 MG PO TABS: 25.0000 mg | ORAL_TABLET | Freq: Every day | ORAL | 0 refills | Status: DC

## 2021-10-30 NOTE — Progress Notes (Signed)
Full thickness meniscal tear right knee with loose body, needs orthopedic referral ?lp

## 2021-11-06 DIAGNOSIS — S83231A Complex tear of medial meniscus, current injury, right knee, initial encounter: Secondary | ICD-10-CM | POA: Diagnosis not present

## 2021-11-17 ENCOUNTER — Other Ambulatory Visit: Payer: Self-pay | Admitting: Legal Medicine

## 2021-11-17 DIAGNOSIS — E782 Mixed hyperlipidemia: Secondary | ICD-10-CM

## 2021-12-26 ENCOUNTER — Other Ambulatory Visit: Payer: Self-pay | Admitting: Cardiology

## 2021-12-26 NOTE — Telephone Encounter (Signed)
Rx request sent to pharmacy.  

## 2022-02-10 ENCOUNTER — Other Ambulatory Visit: Payer: Self-pay | Admitting: Cardiology

## 2022-02-11 ENCOUNTER — Telehealth: Payer: Self-pay | Admitting: Cardiology

## 2022-02-11 MED ORDER — CHLORTHALIDONE 25 MG PO TABS: 25.0000 mg | ORAL_TABLET | Freq: Every day | ORAL | 1 refills | Status: DC

## 2022-02-11 MED ORDER — LISINOPRIL 10 MG PO TABS: 10.0000 mg | ORAL_TABLET | Freq: Every day | ORAL | 1 refills | Status: DC

## 2022-02-11 NOTE — Telephone Encounter (Signed)
*  STAT* If patient is at the pharmacy, call can be transferred to refill team.   1. Which medications need to be refilled? (please list name of each medication and dose if known)   chlorthalidone (HYGROTON) 25 MG tablet    lisinopril (ZESTRIL) 10 MG tablet   2. Which pharmacy/location (including street and city if local pharmacy) is medication to be sent to?  [id number] - [place], [place] - [address]. Do they need a 30 day or 90 day supply?  30 day   Pt has scheduled appt on 03/20/22. Please advise

## 2022-02-11 NOTE — Telephone Encounter (Signed)
Pt scheduled for appt with Dr. Cristal Deer on 03/20/22. Sent in 30 day with 1 refill for each med requested. [facility] [address] in [place].

## 2022-02-11 NOTE — Telephone Encounter (Signed)
Please call pt to schedule overdue appt with Dr. Cristal Deer or APP for refills. Last seen in 2021. Thank you!

## 2022-02-22 HISTORY — PX: MENISCUS REPAIR: SHX5179

## 2022-02-25 ENCOUNTER — Other Ambulatory Visit: Payer: Self-pay | Admitting: Legal Medicine

## 2022-02-25 DIAGNOSIS — F411 Generalized anxiety disorder: Secondary | ICD-10-CM

## 2022-02-27 DIAGNOSIS — M659 Synovitis and tenosynovitis, unspecified: Secondary | ICD-10-CM | POA: Diagnosis not present

## 2022-02-27 DIAGNOSIS — M2241 Chondromalacia patellae, right knee: Secondary | ICD-10-CM | POA: Diagnosis not present

## 2022-02-27 DIAGNOSIS — S83221A Peripheral tear of medial meniscus, current injury, right knee, initial encounter: Secondary | ICD-10-CM | POA: Diagnosis not present

## 2022-02-27 DIAGNOSIS — M6751 Plica syndrome, right knee: Secondary | ICD-10-CM | POA: Diagnosis not present

## 2022-02-27 DIAGNOSIS — M65861 Other synovitis and tenosynovitis, right lower leg: Secondary | ICD-10-CM | POA: Diagnosis not present

## 2022-02-27 DIAGNOSIS — Y999 Unspecified external cause status: Secondary | ICD-10-CM | POA: Diagnosis not present

## 2022-02-27 DIAGNOSIS — K8020 Calculus of gallbladder without cholecystitis without obstruction: Secondary | ICD-10-CM | POA: Diagnosis not present

## 2022-02-27 DIAGNOSIS — X58XXXA Exposure to other specified factors, initial encounter: Secondary | ICD-10-CM | POA: Diagnosis not present

## 2022-03-05 DIAGNOSIS — Z9889 Other specified postprocedural states: Secondary | ICD-10-CM | POA: Diagnosis not present

## 2022-03-05 DIAGNOSIS — M25661 Stiffness of right knee, not elsewhere classified: Secondary | ICD-10-CM | POA: Diagnosis not present

## 2022-03-11 ENCOUNTER — Other Ambulatory Visit: Payer: Self-pay

## 2022-03-11 MED ORDER — LISINOPRIL 10 MG PO TABS: 10.0000 mg | ORAL_TABLET | Freq: Every day | ORAL | 0 refills | Status: DC

## 2022-03-19 ENCOUNTER — Other Ambulatory Visit: Payer: Self-pay

## 2022-03-19 DIAGNOSIS — F411 Generalized anxiety disorder: Secondary | ICD-10-CM

## 2022-03-20 ENCOUNTER — Ambulatory Visit (INDEPENDENT_AMBULATORY_CARE_PROVIDER_SITE_OTHER): Payer: BLUE CROSS/BLUE SHIELD | Admitting: Cardiology

## 2022-03-20 VITALS — BP 148/90 | HR 64 | Ht 77.0 in | Wt 237.6 lb

## 2022-03-20 DIAGNOSIS — Z8249 Family history of ischemic heart disease and other diseases of the circulatory system: Secondary | ICD-10-CM | POA: Diagnosis not present

## 2022-03-20 DIAGNOSIS — E7800 Pure hypercholesterolemia, unspecified: Secondary | ICD-10-CM

## 2022-03-20 DIAGNOSIS — I10 Essential (primary) hypertension: Secondary | ICD-10-CM

## 2022-03-20 DIAGNOSIS — Z7189 Other specified counseling: Secondary | ICD-10-CM

## 2022-03-20 DIAGNOSIS — Z79899 Other long term (current) drug therapy: Secondary | ICD-10-CM

## 2022-03-20 NOTE — Progress Notes (Signed)
Cardiology Office Note:    Date:  03/20/2022   ID:  Edward Terry, DOB [birth year], MRN [id number]  PCP:  Abigail Miyamoto, MD  Cardiologist:  Jodelle Red, MD PhD  Referring MD: Abigail Miyamoto,*   CC: follow up  History of Present Illness:    Edward Terry is a 41 y.o. male with a hx of hypertension, hyperlipidemia  who is seen for follow up today. I initially met him as a new consult 01/08/19 at the request of Abigail Miyamoto,* for the evaluation and management of hypertension and hyperlipidemia.  Cardiovascular risk factors: Comorbid conditions: hypertension, hyperlipidemia. Tobacco use history: former smoker, quit >15 years ago Family history: Mat Gpa had 3V bypass, mother has irregular heart rhythm  At his last visit he was generally doing well. Blood pressure was well controlled. Anxiety was well managed with PRN benzo (not frequent). Alcohol amount varied, either on or off, very little in between. Usually 3-4 beers/night of higher alcohol content beer when he was drinking his most. At that time he was not drinking as he was working on his weight. Alcoholism runs in his family, is cognizant of this.   Today: He states that he had his meniscus repaired three weeks ago. Since February he has not been able to be physically active. Today he is feeling better than he has in months. He looks forward to returning to his exercise routines when he recovers.  He reports that he feels that he has high blood pressure. His blood pressure in the clinic was elevated at 160/102, which he attributes to stress at work and rushing to today's appointment. On recheck his BP improved to 148/90.   Today, he does feel a little dizziness and lightheadedness due to his long day and too much caffeine intake this afternoon.   He notes that before the surgery his potassium was low, but wasn't low enough that he wouldn't be able to have the surgery done.   He denies any  palpitations, chest pain, shortness of breath, or peripheral edema. No headaches, syncope, orthopnea, or PND.   Past Medical History:  Diagnosis Date   Essential hypertension    Generalized anxiety disorder    GERD without esophagitis    Hyperlipidemia 01/23/2016   Hypertension 2013   Mixed hyperlipidemia     Past Surgical History:  Procedure Laterality Date   None     UPPER GASTROINTESTINAL ENDOSCOPY     WISDOM TOOTH EXTRACTION      Current Medications: Current Outpatient Medications on File Prior to Visit  Medication Sig   atorvastatin (LIPITOR) 10 MG tablet TAKE 1 TABLET BY MOUTH EVERY DAY   chlorthalidone (HYGROTON) 25 MG tablet Take 1 tablet (25 mg total) by mouth daily. Please keep your upcoming appointment for refills.   clonazePAM (KLONOPIN) 0.5 MG tablet TAKE 1 TABLET BY MOUTH EVERY DAY AS NEEDED FOR ANXIETY   famotidine (PEPCID) 20 MG tablet TAKE 1 TABLET BY MOUTH EVERYDAY AT BEDTIME   lisinopril (ZESTRIL) 10 MG tablet Take 1 tablet (10 mg total) by mouth daily. Please keep your upcoming appointment for refills.   omeprazole (PRILOSEC) 40 MG capsule TAKE 1 CAPSULE BY MOUTH EVERY DAY   No current facility-administered medications on file prior to visit.     Allergies:   Solarcaine aloe extra [lidocaine]   Social History   Tobacco Use   Smoking status: Former    Types: Cigarettes   Smokeless tobacco: Former    Types: Snuff  Quit date: 2018   Tobacco comments:    during college  Vaping Use   Vaping Use: Never used  Substance Use Topics   Alcohol use: Yes    Alcohol/week: 4.0 standard drinks of alcohol    Types: 4 Cans of beer per week    Comment: 4-5 cans daily   Drug use: No    Family History: The patient's family history includes Breast cancer in his mother; Dementia in his paternal grandmother; Healthy in his father; Heart attack in his maternal grandfather; Other in his paternal grandfather; Pancreatic disease in an other family member. There is no  history of Colon cancer, Rectal cancer, Esophageal cancer, Liver cancer, or Prostate cancer.  ROS:   Please see the history of present illness.   (+) Lightheadedness/Dizziness Additional pertinent ROS otherwise unremarkable.  EKGs/Labs/Other Studies Reviewed:    The following studies were reviewed today: Notes from PCP office 11/20/2018  EKG:  EKG is personally reviewed.   03/20/2022:  NSR at 64 bpm 03/23/2020: NSR with sinus arrhythmia at 63 bpm.  Recent Labs: 06/07/2021: ALT 27; BUN 11; Creatinine, Ser 1.00; Hemoglobin 15.5; Platelets 307; Potassium 4.5; Sodium 135   Recent Lipid Panel    Component Value Date/Time   CHOL 206 (H) 06/07/2021 0906   TRIG 95 06/07/2021 0906   HDL 64 06/07/2021 0906   CHOLHDL 3.2 06/07/2021 0906   LDLCALC 125 (H) 06/07/2021 0906    Physical Exam:    VS:  BP (!) 148/90 (BP Location: Right Arm, Patient Position: Sitting, Cuff Size: Normal)   Pulse 64   Ht 6\' 5"  (1.956 m)   Wt 237 lb 9.6 oz (107.8 kg)   BMI 28.18 kg/m     Wt Readings from Last 3 Encounters:  03/20/22 237 lb 9.6 oz (107.8 kg)  06/07/21 232 lb (105.2 kg)  05/02/21 233 lb (105.7 kg)    GEN: Well nourished, well developed in no acute distress; Right knee brace in place. HEENT: Normal, moist mucous membranes NECK: No JVD CARDIAC: regular rhythm, normal S1 and S2, no rubs or gallops. No murmur. VASCULAR: Radial and DP pulses 2+ bilaterally. No carotid bruits RESPIRATORY:  Clear to auscultation without rales, wheezing or rhonchi  ABDOMEN: Soft, non-tender, non-distended MUSCULOSKELETAL:  Ambulates independently SKIN: Warm and dry, no edema NEUROLOGIC:  Alert and oriented x 3. No focal neuro deficits noted. PSYCHIATRIC:  Normal affect   ASSESSMENT:    1. Essential hypertension   2. Medication management   3. Pure hypercholesterolemia   4. Family history of heart disease   5. Counseling on health promotion and disease prevention     PLAN:    Hypercholesterolemia:  reviewed today. Tolerating atorvastatin 10 mg daily. LDL 125. -recheck labs   Hypertension:  -elevated significantly initially (172/110), improved on recheck but not at goal -for now, continue lisinopril and chlorthalidone -check BMET given reported low K -would uptitrate lisinopril or change to ARB if remains elevated -goal <130/80 -reviewed how to check home BP  Cardiac risk counseling and prevention recommendations Family history of heart disease -recommend heart healthy/Mediterranean diet, with whole grains, fruits, vegetable, fish, lean meats, nuts, and olive oil. Limit salt. -recommend moderate walking, 3-5 times/week for 30-50 minutes each session. Aim for at least 150 minutes.week. Goal should be pace of 3 miles/hours, or walking 1.5 miles in 30 minutes -recommend avoidance of tobacco products.  -Avoid excess alcohol. We emphasized this again today -The 10-year ASCVD risk score (Arnett DK, et al., 2019) is: 1.9%  Values used to calculate the score:     Age: 70 years     Sex: Male     Is Non-Hispanic African American: No     Diabetic: No     Tobacco smoker: No     Systolic Blood Pressure: 144 mmHg     Is BP treated: Yes     HDL Cholesterol: 64 mg/dL     Total Cholesterol: 206 mg/dL  -with family history, his ASCVD risk may be higher than suggested by the calculator  Plan for follow up: 6 months or sooner as needed  Medication Adjustments/Labs and Tests Ordered: Current medicines are reviewed at length with the patient today.  Concerns regarding medicines are outlined above.   Orders Placed This Encounter  Procedures   Basic metabolic panel   Lipid panel   EKG 12-Lead   No orders of the defined types were placed in this encounter.  Patient Instructions  Medication Instructions:  Your Physician recommend you continue on your current medication as directed.    *If you need a refill on your cardiac medications before your next appointment, please call your  pharmacy*   Lab Work: Your provider has recommended lab work (fasting lipid, BMP). Please have this collected at [facility] [place]. The lab is open 8:00 am - 4:30 pm. Please avoid 12:00p - 1:00p for lunch hour. You do not need an appointment. Please go to [address] [place], [place] [address]. This is in the Primary Care office on the 3rd floor, let them know you are there for blood work and they will direct you to the lab.    Testing/Procedures: None ordered today   Follow-Up: At [facility], you and your health needs are our priority.  As part of our continuing mission to provide you with exceptional heart care, we have created designated Provider Care Teams.  These Care Teams include your primary Cardiologist (physician) and Advanced Practice Providers (APPs -  Physician Assistants and Nurse Practitioners) who all work together to provide you with the care you need, when you need it.  We recommend signing up for the patient portal called "MyChart".  Sign up information is provided on this After Visit Summary.  MyChart is used to connect with patients for Virtual Visits (Telemedicine).  Patients are able to view lab/test results, encounter notes, upcoming appointments, etc.  Non-urgent messages can be sent to your provider as well.   To learn more about what you can do with MyChart, go to [email or web address].    Your next appointment:   6 month(s)  The format for your next appointment:   In Person  Provider:   Buford Dresser, MD    -counseled on how to check blood pressure:  -sit comfortably in a chair, feet uncrossed and flat on floor, for 5-10 minutes  -arm ideally should rest at the level of the heart. However, arm should be relaxed and not tense (for example, do not hold the arm up unsupported)  -avoid exercise, caffeine, and tobacco for at least 30 minutes prior to BP reading  -don't take BP cuff reading over clothes  (always place on skin directly)  -I prefer to know how well the medication is working, so I would like you to take your readings 1-2 hours after taking your blood pressure medication if possible          I,Mathew Stumpf,acting as a scribe for [employer], MD.,have documented all relevant documentation on the behalf of  Buford Dresser, MD,as directed by  Buford Dresser, MD while in the presence of Buford Dresser, MD.  I, Buford Dresser, MD, have reviewed all documentation for this visit. The documentation on 04/22/22 for the exam, diagnosis, procedures, and orders are all accurate and complete.   Signed, Buford Dresser, MD PhD 03/20/2022     [place]

## 2022-03-20 NOTE — Patient Instructions (Addendum)
Medication Instructions:  Your Physician recommend you continue on your current medication as directed.    *If you need a refill on your cardiac medications before your next appointment, please call your pharmacy*   Lab Work: Your provider has recommended lab work (fasting lipid, BMP). Please have this collected at [facility] [place]. The lab is open 8:00 am - 4:30 pm. Please avoid 12:00p - 1:00p for lunch hour. You do not need an appointment. Please go to [address] [place], [place] [address]. This is in the Primary Care office on the 3rd floor, let them know you are there for blood work and they will direct you to the lab.    Testing/Procedures: None ordered today   Follow-Up: At [facility], you and your health needs are our priority.  As part of our continuing mission to provide you with exceptional heart care, we have created designated Provider Care Teams.  These Care Teams include your primary Cardiologist (physician) and Advanced Practice Providers (APPs -  Physician Assistants and Nurse Practitioners) who all work together to provide you with the care you need, when you need it.  We recommend signing up for the patient portal called "MyChart".  Sign up information is provided on this After Visit Summary.  MyChart is used to connect with patients for Virtual Visits (Telemedicine).  Patients are able to view lab/test results, encounter notes, upcoming appointments, etc.  Non-urgent messages can be sent to your provider as well.   To learn more about what you can do with MyChart, go to [email or web address].    Your next appointment:   6 month(s)  The format for your next appointment:   In Person  Provider:   Buford Dresser, MD    -counseled on how to check blood pressure:  -sit comfortably in a chair, feet uncrossed and flat on floor, for 5-10 minutes  -arm ideally should rest at the level of the heart. However, arm should be  relaxed and not tense (for example, do not hold the arm up unsupported)  -avoid exercise, caffeine, and tobacco for at least 30 minutes prior to BP reading  -don't take BP cuff reading over clothes (always place on skin directly)  -I prefer to know how well the medication is working, so I would like you to take your readings 1-2 hours after taking your blood pressure medication if possible

## 2022-04-01 ENCOUNTER — Telehealth (HOSPITAL_BASED_OUTPATIENT_CLINIC_OR_DEPARTMENT_OTHER): Payer: Self-pay

## 2022-04-01 MED ORDER — CHLORTHALIDONE 25 MG PO TABS: 25.0000 mg | ORAL_TABLET | Freq: Every day | ORAL | 1 refills | Status: DC

## 2022-04-01 NOTE — Telephone Encounter (Signed)
Received fax from [facility] requesting refills for Chlorthalidone. Rx request sent to pharmacy.

## 2022-04-06 ENCOUNTER — Other Ambulatory Visit: Payer: Self-pay | Admitting: Cardiology

## 2022-04-08 NOTE — Telephone Encounter (Signed)
Rx request sent to pharmacy.  

## 2022-04-16 ENCOUNTER — Encounter: Payer: Self-pay | Admitting: Legal Medicine

## 2022-04-16 ENCOUNTER — Ambulatory Visit (INDEPENDENT_AMBULATORY_CARE_PROVIDER_SITE_OTHER): Payer: BLUE CROSS/BLUE SHIELD | Admitting: Legal Medicine

## 2022-04-16 VITALS — BP 150/90 | HR 95 | Temp 98.3°F | Resp 14 | Ht 77.0 in | Wt 239.0 lb

## 2022-04-16 DIAGNOSIS — I10 Essential (primary) hypertension: Secondary | ICD-10-CM

## 2022-04-16 DIAGNOSIS — Z0000 Encounter for general adult medical examination without abnormal findings: Secondary | ICD-10-CM | POA: Diagnosis not present

## 2022-04-16 DIAGNOSIS — E782 Mixed hyperlipidemia: Secondary | ICD-10-CM

## 2022-04-16 DIAGNOSIS — F411 Generalized anxiety disorder: Secondary | ICD-10-CM | POA: Diagnosis not present

## 2022-04-16 MED ORDER — CLONAZEPAM 0.5 MG PO TABS: 0.5000 mg | ORAL_TABLET | Freq: Every day | ORAL | 3 refills | Status: DC | PRN

## 2022-04-16 NOTE — Progress Notes (Signed)
Subjective:  Patient ID: Edward Terry, male    DOB: 1980/09/09  Age: 41 y.o. MRN: [id number]  Chief Complaint  Patient presents with   Annual Exam    HPI  Well Adult Physical: Patient here for a comprehensive physical exam.The patient reports problems - anxiety Do you take any herbs or supplements that were not prescribed by a doctor? no Are you taking calcium supplements? no Are you taking aspirin daily? no  Encounter for general adult medical examination without abnormal findings  Physical ("At Risk" items are starred): Patient's last physical exam was 1 year ago .  Patient wears a seat belt, has smoke detectors, has carbon monoxide detectors, practices appropriate gun safety, and wears sunscreen with extended sun exposure. Dental Care: biannual cleanings, brushes and flosses daily. Ophthalmology/Optometry: Annual visit.  Hearing loss: none Vision impairments: none Last PSA: NA  Eating yogert, he has right osteotomy. He is on heart healthy diet.  Flowsheet Row Office Visit from 06/07/2021 in [facility]  PHQ-2 Total Score 0               Social History   Socioeconomic History   Marital status: Married    Spouse name: Not on file   Number of children: 2   Years of education: Not on file   Highest education level: Not on file  Occupational History   Occupation: Sales  Tobacco Use   Smoking status: Former    Types: Cigarettes   Smokeless tobacco: Former    Types: Snuff    Quit date: 2018   Tobacco comments:    during college  Vaping Use   Vaping Use: Never used  Substance and Sexual Activity   Alcohol use: Yes    Alcohol/week: 4.0 standard drinks of alcohol    Types: 4 Cans of beer per week    Comment: 4-5 cans daily   Drug use: No   Sexual activity: Yes    Partners: Female  Other Topics Concern   Not on file  Social History Narrative   Not on file   Social Determinants of Health   Financial Resource Strain: Not on file  Food  Insecurity: Not on file  Transportation Needs: Not on file  Physical Activity: Not on file  Stress: Not on file  Social Connections: Not on file   Past Medical History:  Diagnosis Date   Essential hypertension    Generalized anxiety disorder    GERD without esophagitis    Hyperlipidemia 01/23/2016   Hypertension 2013   Mixed hyperlipidemia    Past Surgical History:  Procedure Laterality Date   None     UPPER GASTROINTESTINAL ENDOSCOPY     WISDOM TOOTH EXTRACTION      Family History  Problem Relation Age of Onset   Breast cancer Mother    Healthy Father    Heart attack Maternal Grandfather    Dementia Paternal Grandmother    Other Paternal Grandfather        suicide   Pancreatic disease Other        great aunt   Colon cancer Neg Hx    Rectal cancer Neg Hx    Esophageal cancer Neg Hx    Liver cancer Neg Hx    Prostate cancer Neg Hx    Social History   Socioeconomic History   Marital status: Married    Spouse name: Not on file   Number of children: 2   Years of education: Not on file  Highest education level: Not on file  Occupational History   Occupation: Sales  Tobacco Use   Smoking status: Former    Types: Cigarettes   Smokeless tobacco: Former    Types: Snuff    Quit date: 2018   Tobacco comments:    during college  Vaping Use   Vaping Use: Never used  Substance and Sexual Activity   Alcohol use: Yes    Alcohol/week: 4.0 standard drinks of alcohol    Types: 4 Cans of beer per week    Comment: 4-5 cans daily   Drug use: No   Sexual activity: Yes    Partners: Female  Other Topics Concern   Not on file  Social History Narrative   Not on file   Social Determinants of Health   Financial Resource Strain: Not on file  Food Insecurity: Not on file  Transportation Needs: Not on file  Physical Activity: Not on file  Stress: Not on file  Social Connections: Not on file   Review of Systems  Constitutional:  Negative for chills, fatigue, fever and  unexpected weight change.  HENT:  Negative for congestion, ear pain, sinus pain and sore throat.   Eyes:  Negative for visual disturbance.  Respiratory:  Negative for cough and shortness of breath.   Cardiovascular:  Negative for chest pain and palpitations.  Gastrointestinal:  Negative for abdominal pain, blood in stool, constipation, diarrhea, nausea and vomiting.  Endocrine: Negative for polydipsia.  Genitourinary:  Negative for dysuria.  Musculoskeletal:  Negative for back pain.  Skin:  Negative for rash.  Neurological:  Negative for headaches.     Objective:  BP (!) 150/90   Pulse 95   Temp 98.3 F (36.8 C)   Resp 14   Ht 6\' 5"  (1.956 m)   Wt 239 lb (108.4 kg)   SpO2 100%   BMI 28.34 kg/m      04/16/2022    2:28 PM 03/20/2022    4:59 PM 03/20/2022    4:45 PM  BP/Weight  Systolic BP 150 148 160  Diastolic BP 90 90 102  Wt. (Lbs) 239    BMI 28.34 kg/m2      Physical Exam Vitals reviewed.  Constitutional:      General: He is not in acute distress.    Appearance: Normal appearance.  HENT:     Head: Normocephalic and atraumatic.     Right Ear: Tympanic membrane normal.     Left Ear: Tympanic membrane normal.     Nose: Nose normal.     Mouth/Throat:     Mouth: Mucous membranes are moist.     Pharynx: Oropharynx is clear.  Eyes:     Extraocular Movements: Extraocular movements intact.     Conjunctiva/sclera: Conjunctivae normal.     Pupils: Pupils are equal, round, and reactive to light.  Cardiovascular:     Rate and Rhythm: Normal rate and regular rhythm.     Pulses: Normal pulses.     Heart sounds: Normal heart sounds. No murmur heard.    No gallop.  Pulmonary:     Effort: Pulmonary effort is normal. No respiratory distress.     Breath sounds: No wheezing.  Abdominal:     General: Abdomen is flat. Bowel sounds are normal. There is no distension.     Palpations: Abdomen is soft.     Tenderness: There is no abdominal tenderness.  Genitourinary:     Prostate: Normal.  Musculoskeletal:  General: Normal range of motion.     Cervical back: Normal range of motion.     Right lower leg: No edema.     Left lower leg: No edema.  Skin:    General: Skin is warm and dry.     Capillary Refill: Capillary refill takes less than 2 seconds.  Neurological:     General: No focal deficit present.     Mental Status: He is alert and oriented to person, place, and time. Mental status is at baseline.  Psychiatric:        Mood and Affect: Mood normal.        Behavior: Behavior normal.        Thought Content: Thought content normal.        Judgment: Judgment normal.     Lab Results  Component Value Date   WBC 5.5 06/07/2021   HGB 15.5 06/07/2021   HCT 46.3 06/07/2021   PLT 307 06/07/2021   GLUCOSE 98 06/07/2021   CHOL 206 (H) 06/07/2021   TRIG 95 06/07/2021   HDL 64 06/07/2021   LDLCALC 125 (H) 06/07/2021   ALT 27 06/07/2021   AST 29 06/07/2021   NA 135 06/07/2021   K 4.5 06/07/2021   CL 91 (L) 06/07/2021   CREATININE 1.00 06/07/2021   BUN 11 06/07/2021   CO2 30 (H) 06/07/2021      Assessment & Plan:   Problem List Items Addressed This Visit       Cardiovascular and Mediastinum   Essential hypertension - Primary An individual hypertension care plan was established and reinforced today.  The patient's status was assessed using clinical findings on exam and labs or diagnostic tests. The patient's success at meeting treatment goals on disease specific evidence-based guidelines and found to be fair controlled. SELF MANAGEMENT: The patient and I together assessed ways to personally work towards obtaining the recommended goals. RECOMMENDATIONS: avoid decongestants found in common cold remedies, decrease consumption of alcohol, perform routine monitoring of BP with home BP cuff, exercise, reduction of dietary salt, take medicines as prescribed, try not to miss doses and quit smoking.  Regular exercise and maintaining a healthy weight  is needed.  Stress reduction may help. A CLINICAL SUMMARY including written plan identify barriers to care unique to individual due to social or financial issues.  We attempt to mutually creat solutions for individual and family understanding.      Other   Mixed hyperlipidemia AN INDIVIDUAL CARE PLAN for hyperlipidemia/ cholesterol was established and reinforced today.  The patient's status was assessed using clinical findings on exam, lab and other diagnostic tests. The patient's disease status was assessed based on evidence-based guidelines and found to be fair controlled. MEDICATIONS were reviewed. SELF MANAGEMENT GOALS have been discussed and patient's success at attaining the goal of low cholesterol was assessed. RECOMMENDATION given include regular exercise 3 days a week and low cholesterol/low fat diet. CLINICAL SUMMARY including written plan to identify barriers unique to the patient due to social or economic  reasons was discussed.     Generalized anxiety disorder   Relevant Medications   clonazePAM (KLONOPIN) 0.5 MG tablet   Other Relevant Orders   Lipid panel Continue clonazepam   Routine general medical examination at a health care facility   Relevant Orders   Comprehensive metabolic panel   Lipid panel   CBC with Differential/Platelet   PSA   TSH Wellness information given    Body mass index is 28.34 kg/m.   These  are the goals we discussed:  Goals      Cut out extra servings     DIET - REDUCE FAT INTAKE         This is a list of the screening recommended for you and due dates:  Health Maintenance  Topic Date Due   COVID-19 Vaccine (4 - Pfizer risk series) 08/07/2020   Tetanus Vaccine  04/14/2024   Flu Shot  Completed   HPV Vaccine  Aged Out   Hepatitis C Screening: USPSTF Recommendation to screen - Ages 18-79 yo.  Discontinued   HIV Screening  Discontinued     Meds ordered this encounter  Medications   clonazePAM (KLONOPIN) 0.5 MG tablet    Sig: Take  1 tablet (0.5 mg total) by mouth daily as needed for anxiety.    Dispense:  30 tablet    Refill:  3    This request is for a new prescription for a controlled substance as required by Federal/State law.     Follow-up: Return in about 6 months (around 10/16/2022).  An After Visit Summary was printed and given to the patient.  Brent Bulla, MD [facility] [phone]

## 2022-04-16 NOTE — Patient Instructions (Signed)

## 2022-04-22 ENCOUNTER — Encounter (HOSPITAL_BASED_OUTPATIENT_CLINIC_OR_DEPARTMENT_OTHER): Payer: Self-pay | Admitting: Cardiology

## 2022-06-03 ENCOUNTER — Other Ambulatory Visit (HOSPITAL_BASED_OUTPATIENT_CLINIC_OR_DEPARTMENT_OTHER): Payer: Self-pay | Admitting: Cardiology

## 2022-06-03 NOTE — Telephone Encounter (Signed)
Rx(s) sent to pharmacy electronically.  

## 2022-06-08 ENCOUNTER — Other Ambulatory Visit: Payer: Self-pay | Admitting: Gastroenterology

## 2022-06-27 ENCOUNTER — Other Ambulatory Visit: Payer: Self-pay

## 2022-06-27 DIAGNOSIS — E782 Mixed hyperlipidemia: Secondary | ICD-10-CM

## 2022-06-27 MED ORDER — ATORVASTATIN CALCIUM 10 MG PO TABS: 10.0000 mg | ORAL_TABLET | Freq: Every day | ORAL | 2 refills | Status: DC

## 2022-07-10 ENCOUNTER — Other Ambulatory Visit: Payer: Self-pay | Admitting: Gastroenterology

## 2022-08-03 ENCOUNTER — Other Ambulatory Visit: Payer: Self-pay | Admitting: Cardiology

## 2022-08-30 NOTE — Progress Notes (Incomplete)
Cardiology Office Note:    Date:  08/30/2022   ID:  JAVYON Terry, DOB July 14, 1980, MRN [id number]  PCP:  Lillard Anes, MD (Inactive)  Cardiologist:  Buford Dresser, MD PhD  Referring MD: Lillard Anes,*   CC: follow up  History of Present Illness:    Edward Terry is a 42 y.o. male with a hx of hypertension, hyperlipidemia  who is seen for follow up today. I initially met him as a new consult 01/08/19 at the request of Lillard Anes,* for the evaluation and management of hypertension and hyperlipidemia.  Cardiovascular risk factors: Comorbid conditions: hypertension, hyperlipidemia. Tobacco use history: former smoker, quit >15 years ago Family history: Mat Gpa had 3V bypass, mother has irregular heart rhythm  At his 02/2020 appointment, blood pressure was well controlled. Anxiety was well managed with PRN benzo (not frequent). Alcohol amount varied, either on or off, very little in between. Usually 3-4 beers/night of higher alcohol content beer when he was drinking his most. Alcoholism runs in his family, is cognizant of this.   At his last appointment, his physical activity was limited during recovery from a meniscus repair. His blood pressure was elevated in clinic which he attributed mostly to stress; improved on recheck. No changes were made. We reviewed how to check his home BP.  ***  Today,  He denies any palpitations, chest pain, shortness of breath, or peripheral edema. No lightheadedness, headaches, syncope, orthopnea, or PND.  (+)  Past Medical History:  Diagnosis Date   Essential hypertension    Generalized anxiety disorder    GERD without esophagitis    Hyperlipidemia 01/23/2016   Hypertension 2013   Mixed hyperlipidemia     Past Surgical History:  Procedure Laterality Date   None     UPPER GASTROINTESTINAL ENDOSCOPY     WISDOM TOOTH EXTRACTION      Current Medications: Current Outpatient Medications on File  Prior to Visit  Medication Sig   atorvastatin (LIPITOR) 10 MG tablet Take 1 tablet (10 mg total) by mouth daily.   chlorthalidone (HYGROTON) 25 MG tablet TAKE 1 TABLET BY MOUTH DAILY   clonazePAM (KLONOPIN) 0.5 MG tablet Take 1 tablet (0.5 mg total) by mouth daily as needed for anxiety.   famotidine (PEPCID) 20 MG tablet Take 1 tablet (20 mg total) by mouth at bedtime. Please call [phone] to schedule an office visit for more refills   lisinopril (ZESTRIL) 10 MG tablet TAKE 1 TABLET BY MOUTH DAILY   omeprazole (PRILOSEC) 40 MG capsule TAKE 1 CAPSULE BY MOUTH EVERY DAY   No current facility-administered medications on file prior to visit.     Allergies:   Solarcaine aloe extra [lidocaine]   Social History   Tobacco Use   Smoking status: Former    Types: Cigarettes   Smokeless tobacco: Former    Types: Snuff    Quit date: 2018   Tobacco comments:    during college  Vaping Use   Vaping Use: Never used  Substance Use Topics   Alcohol use: Yes    Alcohol/week: 4.0 standard drinks of alcohol    Types: 4 Cans of beer per week    Comment: 4-5 cans daily   Drug use: No    Family History: The patient's family history includes Breast cancer in his mother; Dementia in his paternal grandmother; Healthy in his father; Heart attack in his maternal grandfather; Other in his paternal grandfather; Pancreatic disease in an other family member. There is no  history of Colon cancer, Rectal cancer, Esophageal cancer, Liver cancer, or Prostate cancer.  ROS:   Please see the history of present illness.    Additional pertinent ROS otherwise unremarkable.  EKGs/Labs/Other Studies Reviewed:    The following studies were reviewed today: Notes from PCP office 11/20/2018  EKG:  EKG is personally reviewed.   09/02/2022:  EKG was not ordered. 03/20/2022:  NSR at 64 bpm 03/23/2020: NSR with sinus arrhythmia at 63 bpm.  Recent Labs: No results found for requested labs within last 365 days.    Recent Lipid Panel    Component Value Date/Time   CHOL 206 (H) 06/07/2021 0906   TRIG 95 06/07/2021 0906   HDL 64 06/07/2021 0906   CHOLHDL 3.2 06/07/2021 0906   LDLCALC 125 (H) 06/07/2021 0906    Physical Exam:    VS:  There were no vitals taken for this visit.    Wt Readings from Last 3 Encounters:  04/16/22 239 lb (108.4 kg)  03/20/22 237 lb 9.6 oz (107.8 kg)  06/07/21 232 lb (105.2 kg)    GEN: Well nourished, well developed in no acute distress; ***Right knee brace in place. HEENT: Normal, moist mucous membranes NECK: No JVD CARDIAC: regular rhythm, normal S1 and S2, no rubs or gallops. No murmur. VASCULAR: Radial and DP pulses 2+ bilaterally. No carotid bruits RESPIRATORY:  Clear to auscultation without rales, wheezing or rhonchi  ABDOMEN: Soft, non-tender, non-distended MUSCULOSKELETAL:  Ambulates independently SKIN: Warm and dry, no edema NEUROLOGIC:  Alert and oriented x 3. No focal neuro deficits noted. PSYCHIATRIC:  Normal affect   ASSESSMENT:    No diagnosis found.  PLAN:    Hypercholesterolemia: reviewed today. Tolerating atorvastatin 10 mg daily. LDL 125. -recheck labs   Hypertension:  -elevated significantly initially (172/110), improved on recheck but not at goal -for now, continue lisinopril and chlorthalidone -check BMET given reported low K -would uptitrate lisinopril or change to ARB if remains elevated -goal <130/80 -reviewed how to check home BP  Cardiac risk counseling and prevention recommendations Family history of heart disease -recommend heart healthy/Mediterranean diet, with whole grains, fruits, vegetable, fish, lean meats, nuts, and olive oil. Limit salt. -recommend moderate walking, 3-5 times/week for 30-50 minutes each session. Aim for at least 150 minutes.week. Goal should be pace of 3 miles/hours, or walking 1.5 miles in 30 minutes -recommend avoidance of tobacco products.  -Avoid excess alcohol. We emphasized this again  today -The 10-year ASCVD risk score (Arnett DK, et al., 2019) is: 1.9%   Values used to calculate the score:     Age: 15 years     Sex: Male     Is Non-Hispanic African American: No     Diabetic: No     Tobacco smoker: No     Systolic Blood Pressure: [id number] mmHg     Is BP treated: Yes     HDL Cholesterol: 64 mg/dL     Total Cholesterol: 206 mg/dL  -with family history, his ASCVD risk may be higher than suggested by the calculator  Plan for follow up: ***6 months or sooner as needed  Medication Adjustments/Labs and Tests Ordered: Current medicines are reviewed at length with the patient today.  Concerns regarding medicines are outlined above.   No orders of the defined types were placed in this encounter.  No orders of the defined types were placed in this encounter.  There are no Patient Instructions on file for this visit.   I,Mathew Stumpf,acting as a Education administrator for [employer]  Harrell Gave, MD.,have documented all relevant documentation on the behalf of Buford Dresser, MD,as directed by  Buford Dresser, MD while in the presence of Buford Dresser, MD.  I, Madelin Rear, have reviewed all documentation for this visit. The documentation on 08/30/22 for the exam, diagnosis, procedures, and orders are all accurate and complete.   Signed, Buford Dresser, MD PhD 08/30/2022     [place]

## 2022-09-02 ENCOUNTER — Ambulatory Visit (HOSPITAL_BASED_OUTPATIENT_CLINIC_OR_DEPARTMENT_OTHER): Payer: BLUE CROSS/BLUE SHIELD | Admitting: Cardiology

## 2022-09-30 ENCOUNTER — Other Ambulatory Visit: Payer: Self-pay | Admitting: Family Medicine

## 2022-09-30 DIAGNOSIS — E782 Mixed hyperlipidemia: Secondary | ICD-10-CM

## 2022-10-21 ENCOUNTER — Ambulatory Visit: Payer: BLUE CROSS/BLUE SHIELD | Admitting: Family Medicine

## 2022-11-04 ENCOUNTER — Ambulatory Visit: Payer: BLUE CROSS/BLUE SHIELD | Admitting: Physician Assistant

## 2022-11-13 ENCOUNTER — Other Ambulatory Visit: Payer: Self-pay

## 2022-11-13 DIAGNOSIS — F411 Generalized anxiety disorder: Secondary | ICD-10-CM

## 2022-11-14 ENCOUNTER — Other Ambulatory Visit: Payer: Self-pay

## 2022-11-14 DIAGNOSIS — F411 Generalized anxiety disorder: Secondary | ICD-10-CM

## 2022-11-14 MED ORDER — CLONAZEPAM 0.5 MG PO TABS: 0.5000 mg | ORAL_TABLET | Freq: Every day | ORAL | 1 refills | Status: DC | PRN

## 2022-11-14 NOTE — Telephone Encounter (Signed)
Patient has scheduled an appointment can you send in rx refill ?

## 2023-01-06 DIAGNOSIS — L308 Other specified dermatitis: Secondary | ICD-10-CM | POA: Diagnosis not present

## 2023-01-06 NOTE — Progress Notes (Unsigned)
Subjective:  Patient ID: Edward Terry, male    DOB: June 23, 1981  Age: 42 y.o. MRN: [id number]  Chief Complaint  Patient presents with   Medical Management of Chronic Issues    HPI   Hypertension: Lisinopril 10 mg daily, chlorthalidone 25 mg daily.  Hyperlipidemia: Atorvastatin 10 mg daily.  GERD: Omeprazole 40 mg daily, famotidine 20 mg daily.     06/07/2021    8:39 AM 05/02/2021    9:24 AM 12/15/2019    9:05 AM  Depression screen PHQ 2/9  Decreased Interest 0 0 0  Down, Depressed, Hopeless 0 0 0  PHQ - 2 Score 0 0 0        06/07/2021    8:39 AM  Fall Risk   Falls in the past year? 0  Number falls in past yr: 0  Injury with Fall? 0  Risk for fall due to : No Fall Risks  Follow up Falls evaluation completed    Patient Care Team: Abigail Miyamoto, MD (Inactive) as PCP - General (Family Medicine) Jodelle Red, MD as PCP - Cardiology (Cardiology) Jodelle Red, MD as Consulting Physician (Cardiology)   Review of Systems  Current Outpatient Medications on File Prior to Visit  Medication Sig Dispense Refill   atorvastatin (LIPITOR) 10 MG tablet TAKE 1 TABLET BY MOUTH DAILY 90 tablet 0   chlorthalidone (HYGROTON) 25 MG tablet TAKE 1 TABLET BY MOUTH DAILY 90 tablet 2   clonazePAM (KLONOPIN) 0.5 MG tablet Take 1 tablet (0.5 mg total) by mouth daily as needed for anxiety. 30 tablet 1   famotidine (PEPCID) 20 MG tablet Take 1 tablet (20 mg total) by mouth at bedtime. Please call [phone] to schedule an office visit for more refills 30 tablet 0   lisinopril (ZESTRIL) 10 MG tablet TAKE 1 TABLET BY MOUTH DAILY 90 tablet 2   omeprazole (PRILOSEC) 40 MG capsule TAKE 1 CAPSULE BY MOUTH EVERY DAY 90 capsule 1   No current facility-administered medications on file prior to visit.   Past Medical History:  Diagnosis Date   Essential hypertension    Generalized anxiety disorder    GERD without esophagitis    Hyperlipidemia 01/23/2016    Hypertension 2013   Mixed hyperlipidemia    Past Surgical History:  Procedure Laterality Date   None     UPPER GASTROINTESTINAL ENDOSCOPY     WISDOM TOOTH EXTRACTION      Family History  Problem Relation Age of Onset   Breast cancer Mother    Healthy Father    Heart attack Maternal Grandfather    Dementia Paternal Grandmother    Other Paternal Grandfather        suicide   Pancreatic disease Other        great aunt   Colon cancer Neg Hx    Rectal cancer Neg Hx    Esophageal cancer Neg Hx    Liver cancer Neg Hx    Prostate cancer Neg Hx    Social History   Socioeconomic History   Marital status: Married    Spouse name: Not on file   Number of children: 2   Years of education: Not on file   Highest education level: Not on file  Occupational History   Occupation: Sales  Tobacco Use   Smoking status: Former    Types: Cigarettes   Smokeless tobacco: Former    Types: Snuff    Quit date: 2018   Tobacco comments:    during college  Vaping  Use   Vaping status: Never Used  Substance and Sexual Activity   Alcohol use: Yes    Alcohol/week: 4.0 standard drinks of alcohol    Types: 4 Cans of beer per week    Comment: 4-5 cans daily   Drug use: No   Sexual activity: Yes    Partners: Female  Other Topics Concern   Not on file  Social History Narrative   Not on file   Social Determinants of Health   Financial Resource Strain: Not on file  Food Insecurity: Not on file  Transportation Needs: Not on file  Physical Activity: Not on file  Stress: Not on file  Social Connections: Not on file    Objective:  There were no vitals taken for this visit.     04/16/2022    2:28 PM 03/20/2022    4:59 PM 03/20/2022    4:45 PM  BP/Weight  Systolic BP 150 148 160  Diastolic BP 90 90 102  Wt. (Lbs) 239    BMI 28.34 kg/m2      Physical Exam  Diabetic Foot Exam - Simple   No data filed      Lab Results  Component Value Date   WBC 5.5 06/07/2021   HGB 15.5  06/07/2021   HCT 46.3 06/07/2021   PLT 307 06/07/2021   GLUCOSE 98 06/07/2021   CHOL 206 (H) 06/07/2021   TRIG 95 06/07/2021   HDL 64 06/07/2021   LDLCALC 125 (H) 06/07/2021   ALT 27 06/07/2021   AST 29 06/07/2021   NA 135 06/07/2021   K 4.5 06/07/2021   CL 91 (L) 06/07/2021   CREATININE 1.00 06/07/2021   BUN 11 06/07/2021   CO2 30 (H) 06/07/2021      Assessment & Plan:    Essential hypertension  GERD without esophagitis  Mixed hyperlipidemia  Generalized anxiety disorder     No orders of the defined types were placed in this encounter.   No orders of the defined types were placed in this encounter.    Follow-up: No follow-ups on file.   I,Marla I Leal-Borjas,acting as a scribe for [employer], PA.,have documented all relevant documentation on the behalf of Langley Gauss, PA,as directed by  Langley Gauss, PA while in the presence of Langley Gauss, [place].   An After Visit Summary was printed and given to the patient.  Langley Gauss, [place] [facility] [phone]

## 2023-01-07 ENCOUNTER — Encounter: Payer: Self-pay | Admitting: Physician Assistant

## 2023-01-07 ENCOUNTER — Ambulatory Visit: Payer: BC Managed Care – PPO | Admitting: Physician Assistant

## 2023-01-07 VITALS — BP 118/82 | HR 63 | Temp 97.5°F | Ht 77.0 in | Wt 229.4 lb

## 2023-01-07 DIAGNOSIS — I10 Essential (primary) hypertension: Secondary | ICD-10-CM

## 2023-01-07 DIAGNOSIS — E782 Mixed hyperlipidemia: Secondary | ICD-10-CM

## 2023-01-07 DIAGNOSIS — F411 Generalized anxiety disorder: Secondary | ICD-10-CM

## 2023-01-07 DIAGNOSIS — K219 Gastro-esophageal reflux disease without esophagitis: Secondary | ICD-10-CM | POA: Diagnosis not present

## 2023-01-07 MED ORDER — CLONAZEPAM 0.5 MG PO TABS
0.5000 mg | ORAL_TABLET | Freq: Every day | ORAL | 2 refills | Status: DC | PRN
Start: 2023-01-07 — End: 2023-07-14

## 2023-01-07 NOTE — Assessment & Plan Note (Signed)
Uncontrolled.  Continue to work on eating a healthy diet and exercise.  Labs drawn today.   No major side effects reported, and no issues with compliance. The current medical regimen is effective;  continue present plan with Atorvastatin 10mg  Will adjust medication as needed depending on labs Lab Results  Component Value Date   LDLCALC 125 (H) 06/07/2021

## 2023-01-07 NOTE — Assessment & Plan Note (Signed)
Controlled Continue to take Clonazepam .5mg  as directed Denies any major side effects or issues with the medication Will adjust treatment as needed depending on symptoms

## 2023-01-07 NOTE — Assessment & Plan Note (Signed)
Well controlled.  Continue to work on eating a healthy diet and exercise.  Labs drawn today.   No major side effects reported, and no issues with compliance. The current medical regimen is effective;  continue present plan with Chlorthalidone 25mg , Lisinopril 10mg  Will adjust medication as needed depending on labs BP Readings from Last 3 Encounters:  01/07/23 118/82  04/16/22 (!) 150/90  03/20/22 (!) 148/90

## 2023-01-08 LAB — LIPID PANEL
Chol/HDL Ratio: 3.5 ratio (ref 0.0–5.0)
Cholesterol, Total: 158 mg/dL (ref 100–199)
HDL: 45 mg/dL
LDL Chol Calc (NIH): 96 mg/dL (ref 0–99)
Triglycerides: 90 mg/dL (ref 0–149)
VLDL Cholesterol Cal: 17 mg/dL (ref 5–40)

## 2023-01-08 LAB — CBC WITH DIFFERENTIAL/PLATELET
Basophils Absolute: 0.1 10*3/uL (ref 0.0–0.2)
Basos: 1 %
EOS (ABSOLUTE): 0.2 10*3/uL (ref 0.0–0.4)
Eos: 3 %
Hematocrit: 47.9 % (ref 37.5–51.0)
Hemoglobin: 15.5 g/dL (ref 13.0–17.7)
Immature Grans (Abs): 0 10*3/uL (ref 0.0–0.1)
Immature Granulocytes: 0 %
Lymphocytes Absolute: 2.3 10*3/uL (ref 0.7–3.1)
Lymphs: 35 %
MCH: 29.8 pg (ref 26.6–33.0)
MCHC: 32.4 g/dL (ref 31.5–35.7)
MCV: 92 fL (ref 79–97)
Monocytes Absolute: 0.9 10*3/uL (ref 0.1–0.9)
Monocytes: 13 %
Neutrophils Absolute: 3.2 10*3/uL (ref 1.4–7.0)
Neutrophils: 48 %
Platelets: 307 10*3/uL (ref 150–450)
RBC: 5.2 x10E6/uL (ref 4.14–5.80)
RDW: 12.4 % (ref 11.6–15.4)
WBC: 6.7 10*3/uL (ref 3.4–10.8)

## 2023-01-08 LAB — COMPREHENSIVE METABOLIC PANEL
ALT: 34 IU/L (ref 0–44)
AST: 25 IU/L (ref 0–40)
Albumin: 4.7 g/dL (ref 4.1–5.1)
Alkaline Phosphatase: 73 IU/L (ref 44–121)
BUN/Creatinine Ratio: 15 (ref 9–20)
BUN: 16 mg/dL (ref 6–24)
Bilirubin Total: 0.8 mg/dL (ref 0.0–1.2)
CO2: 28 mmol/L (ref 20–29)
Calcium: 9.8 mg/dL (ref 8.7–10.2)
Chloride: 96 mmol/L (ref 96–106)
Creatinine, Ser: 1.07 mg/dL (ref 0.76–1.27)
Globulin, Total: 2.7 g/dL (ref 1.5–4.5)
Glucose: 99 mg/dL (ref 70–99)
Potassium: 4.5 mmol/L (ref 3.5–5.2)
Sodium: 139 mmol/L (ref 134–144)
Total Protein: 7.4 g/dL (ref 6.0–8.5)
eGFR: 88 mL/min/{1.73_m2} (ref >59)

## 2023-02-24 ENCOUNTER — Other Ambulatory Visit: Payer: Self-pay | Admitting: Family Medicine

## 2023-02-24 DIAGNOSIS — E782 Mixed hyperlipidemia: Secondary | ICD-10-CM

## 2023-02-28 ENCOUNTER — Other Ambulatory Visit (HOSPITAL_BASED_OUTPATIENT_CLINIC_OR_DEPARTMENT_OTHER): Payer: Self-pay | Admitting: Cardiology

## 2023-03-04 DIAGNOSIS — M79642 Pain in left hand: Secondary | ICD-10-CM | POA: Diagnosis not present

## 2023-03-11 DIAGNOSIS — M65842 Other synovitis and tenosynovitis, left hand: Secondary | ICD-10-CM | POA: Diagnosis not present

## 2023-03-11 DIAGNOSIS — M79642 Pain in left hand: Secondary | ICD-10-CM | POA: Diagnosis not present

## 2023-03-11 DIAGNOSIS — M67432 Ganglion, left wrist: Secondary | ICD-10-CM | POA: Diagnosis not present

## 2023-04-18 ENCOUNTER — Other Ambulatory Visit: Payer: Self-pay | Admitting: Cardiology

## 2023-04-24 DIAGNOSIS — M65932 Unspecified synovitis and tenosynovitis, left forearm: Secondary | ICD-10-CM | POA: Diagnosis not present

## 2023-04-28 ENCOUNTER — Other Ambulatory Visit: Payer: Self-pay | Admitting: Cardiology

## 2023-05-25 ENCOUNTER — Other Ambulatory Visit (HOSPITAL_BASED_OUTPATIENT_CLINIC_OR_DEPARTMENT_OTHER): Payer: Self-pay | Admitting: Cardiology

## 2023-05-26 ENCOUNTER — Other Ambulatory Visit: Payer: Self-pay | Admitting: Cardiology

## 2023-05-28 NOTE — Progress Notes (Signed)
Subjective:  Patient ID: Edward Terry, male    DOB: Nov 20, 1980  Age: 42 y.o. MRN: [id number]  Chief Complaint  Patient presents with   Annual Exam    HPI  Well Adult Physical: Patient here for a comprehensive physical exam.The patient reports problems - testicular pain Do you take any herbs or supplements that were not prescribed by a doctor? no Are you taking calcium supplements? no Are you taking aspirin daily? no  Encounter for general adult medical examination without abnormal findings  Physical ("At Risk" items are starred): Patient's last physical exam was 1 year ago .  Patient wears a seat belt, has smoke detectors, has carbon monoxide detectors, practices appropriate gun safety, and wears sunscreen with extended sun exposure. Dental Care: biannual cleanings, brushes and flosses daily. Ophthalmology/Optometry: Annual visit.  Hearing loss: none Vision impairments: none Last PSA:  Discussed the use of AI scribe software for clinical note transcription with the patient, who gave verbal consent to proceed.  History of Present Illness   Edward Terry, a 42 year old male with a history of high blood pressure, anxiety, and acid reflux, presents with a chief complaint of intermittent testicular pain. The pain, described as a sensation of a sharp pain, has been occurring for several years and has progressively worsened. The most recent episode, which lasted about a week, was the most severe to date, with the patient experiencing shooting pain with certain movements. The pain is not constant but comes and goes, even during an episode. The patient denies any trauma, infections, abnormal discharge, or difficulty urinating.  In addition to the testicular pain, the patient also discusses his high blood pressure, which he has been managing with lifestyle changes such as increased physical activity. He recently completed a 5K run and plans to participate in another one. He reports that his  blood pressure has been lower and he has not been experiencing headaches, which were a problem during his last visit.  The patient's anxiety is mostly under control, with occasional episodes that he manages with clonazepam as needed. He also mentions a recent issue with a hand cyst, which was treated by a hand surgeon and has since resolved.  His acid reflux has improved, and he has been able to reduce his omeprazole dosage to once daily. He continues to take Pepcid every night. He has noticed some cramping in his feet, which he attributes to low potassium, and has been adjusting his diet to address this.          05/29/2023    7:51 AM 01/07/2023    7:49 AM 06/07/2021    8:39 AM 05/02/2021    9:24 AM 12/15/2019    9:05 AM  Depression screen PHQ 2/9  Decreased Interest 0 0 0 0 0  Down, Depressed, Hopeless 0 0 0 0 0  PHQ - 2 Score 0 0 0 0 0  Altered sleeping 0      Tired, decreased energy 1      Change in appetite 1      Feeling bad or failure about yourself  0      Trouble concentrating 0      Moving slowly or fidgety/restless 0      Suicidal thoughts 0      PHQ-9 Score 2      Difficult doing work/chores Not difficult at all             12/15/2019    9:05 AM 06/07/2021    8:39 AM  01/07/2023    7:49 AM 05/29/2023    7:53 AM  Fall Risk  Falls in the past year? 0 0 0 0  Was there an injury with Fall? 0 0 0 0  Fall Risk Category Calculator 0 0 0 0  Fall Risk Category (Retired) Low Low    (RETIRED) Patient Fall Risk Level Low fall risk Low fall risk    Patient at Risk for Falls Due to  No Fall Risks No Fall Risks No Fall Risks  Fall risk Follow up Falls evaluation completed Falls evaluation completed Falls evaluation completed Falls evaluation completed              Past Medical History:  Diagnosis Date   Essential hypertension    Generalized anxiety disorder    GERD without esophagitis    Hyperlipidemia 01/23/2016   Hypertension 2013   Mixed hyperlipidemia    Past  Surgical History:  Procedure Laterality Date   MENISCUS REPAIR Right 02/2022   None     UPPER GASTROINTESTINAL ENDOSCOPY     WISDOM TOOTH EXTRACTION      Family History  Problem Relation Age of Onset   Breast cancer Mother    Dementia Father    Heart attack Maternal Grandfather    Dementia Paternal Grandmother    Other Paternal Grandfather        suicide   Pancreatic disease Other        great aunt   Colon cancer Neg Hx    Rectal cancer Neg Hx    Esophageal cancer Neg Hx    Liver cancer Neg Hx    Prostate cancer Neg Hx    Social History   Socioeconomic History   Marital status: Married    Spouse name: Not on file   Number of children: 2   Years of education: Not on file   Highest education level: Not on file  Occupational History   Occupation: Sales  Tobacco Use   Smoking status: Former    Types: Cigarettes   Smokeless tobacco: Former    Types: Snuff    Quit date: 2018   Tobacco comments:    during college  Vaping Use   Vaping status: Never Used  Substance and Sexual Activity   Alcohol use: Yes    Alcohol/week: 4.0 standard drinks of alcohol    Types: 4 Cans of beer per week    Comment: 4-5 cans daily   Drug use: No   Sexual activity: Yes    Partners: Female  Other Topics Concern   Not on file  Social History Narrative   Not on file   Social Determinants of Health   Financial Resource Strain: Low Risk  (01/07/2023)   Overall Financial Resource Strain (CARDIA)    Difficulty of Paying Living Expenses: Not hard at all  Food Insecurity: No Food Insecurity (01/07/2023)   Hunger Vital Sign    Worried About Running Out of Food in the Last Year: Never true    Ran Out of Food in the Last Year: Never true  Transportation Needs: No Transportation Needs (01/07/2023)   PRAPARE - Administrator, Civil Service (Medical): No    Lack of Transportation (Non-Medical): No  Physical Activity: Insufficiently Active (01/07/2023)   Exercise Vital Sign    Days of  Exercise per Week: 3 days    Minutes of Exercise per Session: 30 min  Stress: No Stress Concern Present (01/07/2023)   [employer] of Occupational Health - Occupational  Stress Questionnaire    Feeling of Stress : Not at all  Social Connections: Moderately Integrated (01/07/2023)   Social Connection and Isolation Panel [NHANES]    Frequency of Communication with Friends and Family: More than three times a week    Frequency of Social Gatherings with Friends and Family: More than three times a week    Attends Religious Services: More than 4 times per year    Active Member of [employer] or Organizations: No    Attends Banker Meetings: Never    Marital Status: Married   Review of Systems  Constitutional:  Negative for chills, fatigue and fever.  HENT:  Negative for congestion, ear pain and sore throat.   Respiratory:  Negative for cough and shortness of breath.   Cardiovascular:  Negative for chest pain and palpitations.  Gastrointestinal:  Negative for abdominal pain, constipation, diarrhea, nausea and vomiting.  Genitourinary:  Positive for testicular pain. Negative for difficulty urinating, dysuria, hematuria, penile discharge and urgency.  Musculoskeletal:  Negative for arthralgias, back pain and myalgias.  Skin:  Negative for rash.  Neurological:  Negative for dizziness and headaches.  Psychiatric/Behavioral:  Negative for dysphoric mood.      Objective:  BP 118/78   Pulse 60   Temp 98 F (36.7 C)   Resp 16   Ht 6\' 5"  (1.956 m)   Wt 231 lb (104.8 kg)   SpO2 92%   BMI 27.39 kg/m      05/29/2023    7:43 AM 01/07/2023    8:43 AM 01/07/2023    7:46 AM  BP/Weight  Systolic BP 118 118 118  Diastolic BP 78 82 80  Wt. (Lbs) 231  229.4  BMI 27.39 kg/m2  27.2 kg/m2    Physical Exam Constitutional:      Appearance: Normal appearance.  HENT:     Right Ear: Tympanic membrane normal.     Left Ear: Tympanic membrane normal.     Nose: Nose normal.      Mouth/Throat:     Pharynx: No oropharyngeal exudate or posterior oropharyngeal erythema.  Eyes:     Conjunctiva/sclera: Conjunctivae normal.  Neck:     Vascular: No carotid bruit.  Cardiovascular:     Rate and Rhythm: Normal rate and regular rhythm.     Heart sounds: Normal heart sounds.  Pulmonary:     Effort: Pulmonary effort is normal.     Breath sounds: Normal breath sounds.  Abdominal:     General: Bowel sounds are normal.     Palpations: Abdomen is soft.     Tenderness: There is no abdominal tenderness.     Hernia: A hernia is present. There is no hernia in the left inguinal area or right inguinal area.  Genitourinary:    Pubic Area: No rash.      Penis: Normal and circumcised.      Testes: Normal. Cremasteric reflex is present.        Right: Tenderness, swelling, testicular hydrocele or varicocele not present. Cremasteric reflex is present.         Left: Tenderness, swelling, testicular hydrocele or varicocele not present. Cremasteric reflex is present.      Epididymis:     Right: Normal.     Left: Normal.     Comments: No acute hernia Skin:    Findings: No lesion or rash.  Neurological:     Mental Status: He is alert and oriented to person, place, and time.  Psychiatric:  Behavior: Behavior normal.     Lab Results  Component Value Date   WBC 6.4 05/29/2023   HGB 16.2 05/29/2023   HCT 49.1 05/29/2023   PLT 302 05/29/2023   GLUCOSE 101 (H) 05/29/2023   CHOL 221 (H) 05/29/2023   TRIG 97 05/29/2023   HDL 72 05/29/2023   LDLCALC 132 (H) 05/29/2023   ALT 32 05/29/2023   AST 31 05/29/2023   NA 138 05/29/2023   K 4.7 05/29/2023   CL 92 (L) 05/29/2023   CREATININE 1.04 05/29/2023   BUN 15 05/29/2023   CO2 30 (H) 05/29/2023   HGBA1C 6.0 (H) 05/29/2023      Assessment & Plan:  Annual physical exam Assessment & Plan: -Order complete blood count, kidney and liver function tests, electrolytes, A1C, and cholesterol panel. -Schedule follow-up appointment  in three months to monitor progress and discuss lab results.       Essential hypertension Assessment & Plan: Controlled Continue to monitor BP Will adjust treatment as needed Continue to monitor  Orders: -     CBC with Differential/Platelet -     Comprehensive metabolic panel  Mixed hyperlipidemia Assessment & Plan: Labs drawn today Continue to monitor Will adjust treatment depending on labs.  Orders: -     Hemoglobin A1c -     Lipid panel  Unilateral recurrent inguinal hernia without obstruction or gangrene Assessment & Plan: Recurrent episodes of testicular pain, worsening in severity, with no history of trauma or infection. Suspected inguinal hernia based on clinical presentation. -Order scrotal ultrasound to confirm diagnosis and assess the size of the potential hernia. -Refer to a general surgeon for further evaluation and potential surgical intervention. -Educate on warning signs of complications (e.g., intractable pain, dusky coloration of the scrotum, fever) that would necessitate immediate medical attention.  Orders: -     Ambulatory referral to General Surgery  GERD without esophagitis Assessment & Plan: Controlled with Prilosec and Pepcid. -Wean off Prilosec over the next 1-2 weeks, taking it every other day. -Continue Pepcid as needed.   Generalized anxiety disorder Assessment & Plan: Occasional episodes managed with as-needed Clonazepam. -Continue current management plan.      Body mass index is 27.39 kg/m.   These are the goals we discussed:  Goals      Cut out extra servings     DIET - REDUCE FAT INTAKE         This is a list of the screening recommended for you and due dates:  Health Maintenance  Topic Date Due   COVID-19 Vaccine (4 - 2023-24 season) 02/23/2023   Flu Shot  09/22/2023*   DTaP/Tdap/Td vaccine (2 - Td or Tdap) 04/14/2024   HPV Vaccine  Aged Out   Hepatitis C Screening  Discontinued   HIV Screening  Discontinued   *Topic was postponed. The date shown is not the original due date.     No orders of the defined types were placed in this encounter. Assessment and Plan        Follow-up: Return in about 3 months (around 08/27/2023) for Chronic, [place] Foley.  An After Visit Summary was printed and given to the patient.  Langley Gauss, [facility] [phone]

## 2023-05-29 ENCOUNTER — Ambulatory Visit (INDEPENDENT_AMBULATORY_CARE_PROVIDER_SITE_OTHER): Payer: BC Managed Care – PPO | Admitting: Physician Assistant

## 2023-05-29 ENCOUNTER — Encounter: Payer: Self-pay | Admitting: Physician Assistant

## 2023-05-29 VITALS — BP 118/78 | HR 60 | Temp 98.0°F | Resp 16 | Ht 77.0 in | Wt 231.0 lb

## 2023-05-29 DIAGNOSIS — F411 Generalized anxiety disorder: Secondary | ICD-10-CM

## 2023-05-29 DIAGNOSIS — K219 Gastro-esophageal reflux disease without esophagitis: Secondary | ICD-10-CM

## 2023-05-29 DIAGNOSIS — I10 Essential (primary) hypertension: Secondary | ICD-10-CM

## 2023-05-29 DIAGNOSIS — K4091 Unilateral inguinal hernia, without obstruction or gangrene, recurrent: Secondary | ICD-10-CM

## 2023-05-29 DIAGNOSIS — Z0000 Encounter for general adult medical examination without abnormal findings: Secondary | ICD-10-CM | POA: Diagnosis not present

## 2023-05-29 DIAGNOSIS — E782 Mixed hyperlipidemia: Secondary | ICD-10-CM | POA: Diagnosis not present

## 2023-05-30 LAB — COMPREHENSIVE METABOLIC PANEL
ALT: 32 [IU]/L (ref 0–44)
AST: 31 [IU]/L (ref 0–40)
Albumin: 4.9 g/dL (ref 4.1–5.1)
Alkaline Phosphatase: 78 [IU]/L (ref 44–121)
BUN/Creatinine Ratio: 14 (ref 9–20)
BUN: 15 mg/dL (ref 6–24)
Bilirubin Total: 1 mg/dL (ref 0.0–1.2)
CO2: 30 mmol/L — ABNORMAL HIGH (ref 20–29)
Calcium: 10.5 mg/dL — ABNORMAL HIGH (ref 8.7–10.2)
Chloride: 92 mmol/L — ABNORMAL LOW (ref 96–106)
Creatinine, Ser: 1.04 mg/dL (ref 0.76–1.27)
Globulin, Total: 3 g/dL (ref 1.5–4.5)
Glucose: 101 mg/dL — ABNORMAL HIGH (ref 70–99)
Potassium: 4.7 mmol/L (ref 3.5–5.2)
Sodium: 138 mmol/L (ref 134–144)
Total Protein: 7.9 g/dL (ref 6.0–8.5)
eGFR: 91 mL/min/{1.73_m2} (ref >59)

## 2023-05-30 LAB — CBC WITH DIFFERENTIAL/PLATELET
Basophils Absolute: 0 10*3/uL (ref 0.0–0.2)
Basos: 1 %
EOS (ABSOLUTE): 0.1 10*3/uL (ref 0.0–0.4)
Eos: 2 %
Hematocrit: 49.1 % (ref 37.5–51.0)
Hemoglobin: 16.2 g/dL (ref 13.0–17.7)
Immature Grans (Abs): 0 10*3/uL (ref 0.0–0.1)
Immature Granulocytes: 0 %
Lymphocytes Absolute: 2 10*3/uL (ref 0.7–3.1)
Lymphs: 32 %
MCH: 31.2 pg (ref 26.6–33.0)
MCHC: 33 g/dL (ref 31.5–35.7)
MCV: 95 fL (ref 79–97)
Monocytes Absolute: 0.8 10*3/uL (ref 0.1–0.9)
Monocytes: 13 %
Neutrophils Absolute: 3.4 10*3/uL (ref 1.4–7.0)
Neutrophils: 52 %
Platelets: 302 10*3/uL (ref 150–450)
RBC: 5.19 x10E6/uL (ref 4.14–5.80)
RDW: 12.4 % (ref 11.6–15.4)
WBC: 6.4 10*3/uL (ref 3.4–10.8)

## 2023-05-30 LAB — HEMOGLOBIN A1C
Est. average glucose Bld gHb Est-mCnc: 126 mg/dL
Hgb A1c MFr Bld: 6 % — ABNORMAL HIGH (ref 4.8–5.6)

## 2023-05-30 LAB — LIPID PANEL
Chol/HDL Ratio: 3.1 {ratio} (ref 0.0–5.0)
Cholesterol, Total: 221 mg/dL — ABNORMAL HIGH (ref 100–199)
HDL: 72 mg/dL (ref >39)
LDL Chol Calc (NIH): 132 mg/dL — ABNORMAL HIGH (ref 0–99)
Triglycerides: 97 mg/dL (ref 0–149)
VLDL Cholesterol Cal: 17 mg/dL (ref 5–40)

## 2023-06-02 DIAGNOSIS — K4091 Unilateral inguinal hernia, without obstruction or gangrene, recurrent: Secondary | ICD-10-CM | POA: Insufficient documentation

## 2023-06-02 NOTE — Assessment & Plan Note (Signed)
Controlled Continue to monitor BP Will adjust treatment as needed Continue to monitor

## 2023-06-02 NOTE — Assessment & Plan Note (Signed)
Controlled with Prilosec and Pepcid. -Wean off Prilosec over the next 1-2 weeks, taking it every other day. -Continue Pepcid as needed.

## 2023-06-02 NOTE — Assessment & Plan Note (Signed)
-  Order complete blood count, kidney and liver function tests, electrolytes, A1C, and cholesterol panel. -Schedule follow-up appointment in three months to monitor progress and discuss lab results.

## 2023-06-02 NOTE — Assessment & Plan Note (Signed)
Occasional episodes managed with as-needed Clonazepam. -Continue current management plan.

## 2023-06-02 NOTE — Assessment & Plan Note (Signed)
Recurrent episodes of testicular pain, worsening in severity, with no history of trauma or infection. Suspected inguinal hernia based on clinical presentation. -Order scrotal ultrasound to confirm diagnosis and assess the size of the potential hernia. -Refer to a general surgeon for further evaluation and potential surgical intervention. -Educate on warning signs of complications (e.g., intractable pain, dusky coloration of the scrotum, fever) that would necessitate immediate medical attention.

## 2023-06-02 NOTE — Assessment & Plan Note (Signed)
Labs drawn today Continue to monitor Will adjust treatment depending on labs.

## 2023-06-04 ENCOUNTER — Telehealth: Payer: Self-pay | Admitting: Physician Assistant

## 2023-06-04 NOTE — Telephone Encounter (Signed)
   Edward Terry has been scheduled for the following appointment:  WHAT: SCROTUM ULTRASOUND WHERE: [facility] DATE: 06/06/2023 TIME: 12:30 PM CHECK-IN  Patient has been made aware.

## 2023-06-06 DIAGNOSIS — N50819 Testicular pain, unspecified: Secondary | ICD-10-CM | POA: Diagnosis not present

## 2023-06-06 DIAGNOSIS — N433 Hydrocele, unspecified: Secondary | ICD-10-CM | POA: Diagnosis not present

## 2023-06-06 DIAGNOSIS — N50811 Right testicular pain: Secondary | ICD-10-CM | POA: Diagnosis not present

## 2023-06-09 ENCOUNTER — Encounter: Payer: Self-pay | Admitting: Physician Assistant

## 2023-06-09 ENCOUNTER — Other Ambulatory Visit (HOSPITAL_BASED_OUTPATIENT_CLINIC_OR_DEPARTMENT_OTHER): Payer: Self-pay | Admitting: Cardiology

## 2023-06-11 DIAGNOSIS — K4090 Unilateral inguinal hernia, without obstruction or gangrene, not specified as recurrent: Secondary | ICD-10-CM | POA: Diagnosis not present

## 2023-06-13 ENCOUNTER — Telehealth: Payer: Self-pay | Admitting: Cardiology

## 2023-06-13 ENCOUNTER — Other Ambulatory Visit (HOSPITAL_COMMUNITY): Payer: Self-pay

## 2023-06-13 DIAGNOSIS — I10 Essential (primary) hypertension: Secondary | ICD-10-CM

## 2023-06-13 MED ORDER — LISINOPRIL 10 MG PO TABS
10.0000 mg | ORAL_TABLET | Freq: Every day | ORAL | 0 refills | Status: DC
  Filled 2023-06-13: qty 30, 30d supply, fill #0

## 2023-06-13 MED ORDER — CHLORTHALIDONE 25 MG PO TABS
25.0000 mg | ORAL_TABLET | Freq: Every day | ORAL | 0 refills | Status: DC
  Filled 2023-06-13: qty 30, 30d supply, fill #0

## 2023-06-13 NOTE — Telephone Encounter (Signed)
*  STAT* If patient is at the pharmacy, call can be transferred to refill team.   1. Which medications need to be refilled? (please list name of each medication and dose if known)  lisinopril (ZESTRIL) 10 MG tablet chlorthalidone (HYGROTON) 25 MG tablet   2. Would you like to learn more about the convenience, safety, & potential cost savings by using the [facility]?No   3. Are you open to using the [facility] No   4. Which pharmacy/location (including street and city if local pharmacy) is medication to be sent to? [facility] [id number] - [place], [place] - [address]     5. Do they need a 30 day or 90 day supply? 90 Day Supply  Pt is scheduled to see Dr. Cristal Deer on 08/28/23. Pt is added to the wait list for a sooner appt.

## 2023-06-13 NOTE — Telephone Encounter (Signed)
RX sent to preferred pharmacy 

## 2023-06-16 ENCOUNTER — Other Ambulatory Visit: Payer: Self-pay | Admitting: Cardiology

## 2023-06-16 ENCOUNTER — Other Ambulatory Visit (HOSPITAL_COMMUNITY): Payer: Self-pay

## 2023-06-16 DIAGNOSIS — I10 Essential (primary) hypertension: Secondary | ICD-10-CM

## 2023-06-16 MED ORDER — LISINOPRIL 10 MG PO TABS: 10.0000 mg | ORAL_TABLET | Freq: Every day | ORAL | 0 refills | Status: DC

## 2023-06-16 MED ORDER — CHLORTHALIDONE 25 MG PO TABS: 25.0000 mg | ORAL_TABLET | Freq: Every day | ORAL | 0 refills | Status: DC

## 2023-07-11 ENCOUNTER — Ambulatory Visit: Payer: BC Managed Care – PPO | Admitting: Physician Assistant

## 2023-07-14 ENCOUNTER — Other Ambulatory Visit: Payer: Self-pay | Admitting: Physician Assistant

## 2023-07-14 DIAGNOSIS — F411 Generalized anxiety disorder: Secondary | ICD-10-CM

## 2023-07-16 ENCOUNTER — Ambulatory Visit (HOSPITAL_BASED_OUTPATIENT_CLINIC_OR_DEPARTMENT_OTHER): Payer: BC Managed Care – PPO | Admitting: Cardiology

## 2023-07-17 DIAGNOSIS — F419 Anxiety disorder, unspecified: Secondary | ICD-10-CM | POA: Diagnosis not present

## 2023-07-17 DIAGNOSIS — D176 Benign lipomatous neoplasm of spermatic cord: Secondary | ICD-10-CM | POA: Diagnosis not present

## 2023-07-17 DIAGNOSIS — Z87891 Personal history of nicotine dependence: Secondary | ICD-10-CM | POA: Diagnosis not present

## 2023-07-17 DIAGNOSIS — I10 Essential (primary) hypertension: Secondary | ICD-10-CM | POA: Diagnosis not present

## 2023-07-17 DIAGNOSIS — Z79899 Other long term (current) drug therapy: Secondary | ICD-10-CM | POA: Diagnosis not present

## 2023-07-17 DIAGNOSIS — K219 Gastro-esophageal reflux disease without esophagitis: Secondary | ICD-10-CM | POA: Diagnosis not present

## 2023-07-17 DIAGNOSIS — E785 Hyperlipidemia, unspecified: Secondary | ICD-10-CM | POA: Diagnosis not present

## 2023-07-17 DIAGNOSIS — K4090 Unilateral inguinal hernia, without obstruction or gangrene, not specified as recurrent: Secondary | ICD-10-CM | POA: Diagnosis not present

## 2023-08-23 ENCOUNTER — Other Ambulatory Visit: Payer: Self-pay | Admitting: Family Medicine

## 2023-08-23 DIAGNOSIS — E782 Mixed hyperlipidemia: Secondary | ICD-10-CM

## 2023-08-28 ENCOUNTER — Ambulatory Visit (HOSPITAL_BASED_OUTPATIENT_CLINIC_OR_DEPARTMENT_OTHER): Payer: BC Managed Care – PPO | Admitting: Cardiology

## 2023-08-29 ENCOUNTER — Ambulatory Visit: Payer: BC Managed Care – PPO | Admitting: Physician Assistant

## 2023-08-29 ENCOUNTER — Ambulatory Visit: Payer: BC Managed Care – PPO | Admitting: Family Medicine

## 2023-09-05 ENCOUNTER — Encounter: Payer: Self-pay | Admitting: Physician Assistant

## 2023-09-05 ENCOUNTER — Ambulatory Visit (INDEPENDENT_AMBULATORY_CARE_PROVIDER_SITE_OTHER): Payer: BC Managed Care – PPO | Admitting: Physician Assistant

## 2023-09-05 VITALS — BP 132/88 | HR 66 | Temp 98.3°F | Ht 77.0 in | Wt 226.0 lb

## 2023-09-05 DIAGNOSIS — K219 Gastro-esophageal reflux disease without esophagitis: Secondary | ICD-10-CM

## 2023-09-05 DIAGNOSIS — E782 Mixed hyperlipidemia: Secondary | ICD-10-CM

## 2023-09-05 DIAGNOSIS — F411 Generalized anxiety disorder: Secondary | ICD-10-CM | POA: Diagnosis not present

## 2023-09-05 DIAGNOSIS — R7303 Prediabetes: Secondary | ICD-10-CM

## 2023-09-05 DIAGNOSIS — I10 Essential (primary) hypertension: Secondary | ICD-10-CM | POA: Diagnosis not present

## 2023-09-05 NOTE — Assessment & Plan Note (Signed)
Continue to diet and exercise.

## 2023-09-05 NOTE — Assessment & Plan Note (Addendum)
 Controlled with Pepcid daily, and Omeprazole only PRN.

## 2023-09-05 NOTE — Assessment & Plan Note (Signed)
 Occasional episodes managed with as-needed Clonazepam. -Continue current management plan.

## 2023-09-05 NOTE — Assessment & Plan Note (Signed)
 Labs drawn today Continue to monitor Will adjust treatment depending on labs.

## 2023-09-05 NOTE — Progress Notes (Signed)
 Subjective:  Patient ID: Edward Terry, male    DOB: [birth year]  Age: 43 y.o. MRN: [id number]  Chief Complaint  Patient presents with   Medical Management of Chronic Issues    Discussed the use of AI scribe software for clinical note transcription with the patient, who gave verbal consent to proceed.  History of Present Illness   The patient, with a history of hernia surgery, acid reflux, and occasional anxiety, presents for a chronic check-up. The patient reports a period of decreased activity and less healthy eating habits following hernia surgery seven weeks ago. The patient has been experiencing acid reflux and has been taking omeprazole, but expresses a desire to discontinue this medication. The patient also takes clonazepam occasionally for anxiety. The patient has a family history of acid reflux. The patient plans to increase physical activity and improve diet to manage health conditions. The patient's blood pressure is slightly high, but the patient reports no symptoms such as headaches or vision changes.           09/05/2023    8:30 AM 05/29/2023    7:51 AM 01/07/2023    7:49 AM 06/07/2021    8:39 AM 05/02/2021    9:24 AM  Depression screen PHQ 2/9  Decreased Interest 0 0 0 0 0  Down, Depressed, Hopeless 0 0 0 0 0  PHQ - 2 Score 0 0 0 0 0  Altered sleeping  0     Tired, decreased energy  1     Change in appetite  1     Feeling bad or failure about yourself   0     Trouble concentrating  0     Moving slowly or fidgety/restless  0     Suicidal thoughts  0     PHQ-9 Score  2     Difficult doing work/chores  Not difficult at all           09/05/2023    8:30 AM  Fall Risk   Falls in the past year? 0  Number falls in past yr: 0  Injury with Fall? 0  Risk for fall due to : No Fall Risks  Follow up Falls evaluation completed    Patient Care Team: Langley Gauss, [place] as PCP - General (Physician Assistant) Jodelle Red, MD as PCP - Cardiology  (Cardiology) Jodelle Red, MD as Consulting Physician (Cardiology)   Review of Systems  Constitutional:  Negative for appetite change, fatigue and fever.  HENT:  Negative for congestion, ear pain, sinus pressure and sore throat.   Respiratory:  Negative for cough, chest tightness, shortness of breath and wheezing.   Cardiovascular:  Negative for chest pain and palpitations.  Gastrointestinal:  Negative for abdominal pain, constipation, diarrhea, nausea and vomiting.  Genitourinary:  Negative for dysuria and hematuria.  Musculoskeletal:  Negative for arthralgias, back pain, joint swelling and myalgias.  Skin:  Negative for rash.  Neurological:  Negative for dizziness, weakness and headaches.  Psychiatric/Behavioral:  Negative for dysphoric mood. The patient is not nervous/anxious.     Current Outpatient Medications on File Prior to Visit  Medication Sig Dispense Refill   atorvastatin (LIPITOR) 10 MG tablet TAKE 1 TABLET BY MOUTH DAILY 90 tablet 1   chlorthalidone (HYGROTON) 25 MG tablet Take 1 tablet (25 mg total) by mouth daily. 90 tablet 0   clonazePAM (KLONOPIN) 0.5 MG tablet TAKE 1 TABLET BY MOUTH DAILY AS NEEDED FOR ANXIETY 30 tablet 0   famotidine (PEPCID) 20 MG  tablet Take 1 tablet (20 mg total) by mouth at bedtime. Please call [phone] to schedule an office visit for more refills 30 tablet 0   lisinopril (ZESTRIL) 10 MG tablet Take 1 tablet (10 mg total) by mouth daily. 90 tablet 0   omeprazole (PRILOSEC) 40 MG capsule TAKE 1 CAPSULE BY MOUTH EVERY DAY 90 capsule 1   No current facility-administered medications on file prior to visit.   Past Medical History:  Diagnosis Date   Essential hypertension    Generalized anxiety disorder    GERD without esophagitis    Hyperlipidemia 01/23/2016   Hypertension 2013   Mixed hyperlipidemia    Past Surgical History:  Procedure Laterality Date   MENISCUS REPAIR Right 02/2022   None     UPPER GASTROINTESTINAL ENDOSCOPY      WISDOM TOOTH EXTRACTION      Family History  Problem Relation Age of Onset   Breast cancer Mother    Dementia Father    Heart attack Maternal Grandfather    Dementia Paternal Grandmother    Other Paternal Grandfather        suicide   Pancreatic disease Other        great aunt   Colon cancer Neg Hx    Rectal cancer Neg Hx    Esophageal cancer Neg Hx    Liver cancer Neg Hx    Prostate cancer Neg Hx    Social History   Socioeconomic History   Marital status: Married    Spouse name: Not on file   Number of children: 2   Years of education: Not on file   Highest education level: Not on file  Occupational History   Occupation: Sales  Tobacco Use   Smoking status: Former    Types: Cigarettes   Smokeless tobacco: Former    Types: Snuff    Quit date: 2018   Tobacco comments:    during college  Vaping Use   Vaping status: Never Used  Substance and Sexual Activity   Alcohol use: Yes    Alcohol/week: 4.0 standard drinks of alcohol    Types: 4 Cans of beer per week    Comment: 4-5 cans daily   Drug use: No   Sexual activity: Yes    Partners: Female  Other Topics Concern   Not on file  Social History Narrative   Not on file   Social Drivers of Health   Financial Resource Strain: Low Risk  (01/07/2023)   Overall Financial Resource Strain (CARDIA)    Difficulty of Paying Living Expenses: Not hard at all  Food Insecurity: No Food Insecurity (01/07/2023)   Hunger Vital Sign    Worried About Running Out of Food in the Last Year: Never true    Ran Out of Food in the Last Year: Never true  Transportation Needs: No Transportation Needs (01/07/2023)   PRAPARE - Administrator, Civil Service (Medical): No    Lack of Transportation (Non-Medical): No  Physical Activity: Insufficiently Active (01/07/2023)   Exercise Vital Sign    Days of Exercise per Week: 3 days    Minutes of Exercise per Session: 30 min  Stress: No Stress Concern Present (01/07/2023)   [employer] of Occupational Health - Occupational Stress Questionnaire    Feeling of Stress : Not at all  Social Connections: Moderately Integrated (01/07/2023)   Social Connection and Isolation Panel [NHANES]    Frequency of Communication with Friends and Family: More than three times a week  Frequency of Social Gatherings with Friends and Family: More than three times a week    Attends Religious Services: More than 4 times per year    Active Member of [employer] or Organizations: No    Attends Engineer, structural: Never    Marital Status: Married    Objective:  BP 132/88 (BP Location: Left Arm, Patient Position: Sitting)   Pulse 66   Temp 98.3 F (36.8 C) (Temporal)   Ht 6\' 5"  (1.956 m)   Wt 226 lb (102.5 kg)   SpO2 99%   BMI 26.80 kg/m      09/05/2023    8:14 AM 05/29/2023    7:43 AM 01/07/2023    8:43 AM  BP/Weight  Systolic BP 132 118 118  Diastolic BP 88 78 82  Wt. (Lbs) 226 231   BMI 26.8 kg/m2 27.39 kg/m2     Physical Exam Vitals reviewed.  Constitutional:      Appearance: Normal appearance.  Cardiovascular:     Rate and Rhythm: Normal rate and regular rhythm.     Heart sounds: Normal heart sounds.  Pulmonary:     Effort: Pulmonary effort is normal.     Breath sounds: Normal breath sounds.  Abdominal:     General: Bowel sounds are normal.     Palpations: Abdomen is soft.     Tenderness: There is no abdominal tenderness.  Neurological:     Mental Status: He is alert and oriented to person, place, and time.  Psychiatric:        Mood and Affect: Mood normal.        Behavior: Behavior normal.     Diabetic Foot Exam - Simple   No data filed      Lab Results  Component Value Date   WBC 6.4 05/29/2023   HGB 16.2 05/29/2023   HCT 49.1 05/29/2023   PLT 302 05/29/2023   GLUCOSE 101 (H) 05/29/2023   CHOL 221 (H) 05/29/2023   TRIG 97 05/29/2023   HDL 72 05/29/2023   LDLCALC 132 (H) 05/29/2023   ALT 32 05/29/2023   AST 31 05/29/2023   NA 138  05/29/2023   K 4.7 05/29/2023   CL 92 (L) 05/29/2023   CREATININE 1.04 05/29/2023   BUN 15 05/29/2023   CO2 30 (H) 05/29/2023   HGBA1C 6.0 (H) 05/29/2023      Assessment & Plan:   Essential hypertension Assessment & Plan: Controlled Continue to monitor BP Will adjust treatment as needed Continue to monitor  Orders: -     CBC with Differential/Platelet -     Comprehensive metabolic panel  Mixed hyperlipidemia Assessment & Plan: Labs drawn today Continue to monitor Will adjust treatment depending on labs.  Orders: -     Lipid panel  GERD without esophagitis Assessment & Plan: Controlled with Pepcid daily, and Omeprazole only PRN.    Generalized anxiety disorder Assessment & Plan: Occasional episodes managed with as-needed Clonazepam. -Continue current management plan.   Prediabetes Assessment & Plan: Continue to diet and exercise.   Orders: -     Hemoglobin A1c  Hypercalcemia -     T4, free -     TSH -     Parathyroid hormone, intact (no Ca)     No orders of the defined types were placed in this encounter.   Orders Placed This Encounter  Procedures   CBC with Differential/Platelet   Comprehensive metabolic panel   Lipid panel   Hemoglobin A1c   T4,  free   TSH   Parathyroid hormone, intact (no Ca)     Follow-up: Return in about 6 months (around 03/07/2024) for Chronic, Huston Foley.   I,Lauren M Auman,acting as a Neurosurgeon for [employer], PA.,have documented all relevant documentation on the behalf of Langley Gauss, PA,as directed by  Langley Gauss, PA while in the presence of Langley Gauss, [place].   An After Visit Summary was printed and given to the patient.  Langley Gauss, [place] [facility] [phone]

## 2023-09-05 NOTE — Assessment & Plan Note (Signed)
 Controlled Continue to monitor BP Will adjust treatment as needed Continue to monitor

## 2023-09-05 NOTE — Patient Instructions (Signed)
 VISIT SUMMARY:  During your visit today, we discussed your recovery from hernia surgery, management of acid reflux, slightly elevated blood pressure, and a slightly high calcium level. We also reviewed your general health maintenance and planned for future screenings.  YOUR PLAN:  -POST-HERNIA RECOVERY: You are seven weeks post-surgery and experiencing occasional twinges but no significant pain. We recommend gradually resuming physical activities, starting with home exercises and progressing to gym workouts in six months. Begin with lighter weights and increase repetitions to prevent hernia recurrence. Monitor for any signs of hernia recurrence or complications.  -GASTROESOPHAGEAL REFLUX DISEASE (GERD): GERD is a condition where stomach acid frequently flows back into the tube connecting your mouth and stomach. We discussed dietary modifications to reduce high-acid foods and increase water intake. You can try taking Pepcid in the morning and omeprazole at night on an empty stomach. If your symptoms are controlled, consider reducing the omeprazole dosage to 20 mg. Keep track of specific food triggers and adjust your diet accordingly.  -HYPERTENSION: Your blood pressure is slightly elevated at 132/88 mmHg, which may be due to recent inactivity and dietary habits. We encourage you to monitor your blood pressure at home and make lifestyle modifications, including increased physical activity and dietary adjustments, to manage your blood pressure.  -HYPERCALCEMIA: Hypercalcemia means you have a slightly elevated calcium level in your blood, which could be related to parathyroid issues. We have ordered blood tests to evaluate your thyroid and parathyroid function.  -GENERAL HEALTH MAINTENANCE: We discussed plans for colon cancer screening after July, preferring non-invasive methods. We will also reassess your A1c, cholesterol, and other health parameters during your follow-up appointment in six  months.  INSTRUCTIONS:  Please schedule a follow-up appointment in six months to reassess your A1c, cholesterol, and other health parameters. If your blood work indicates significant changes, we may adjust the follow-up schedule. If your A1c is 6.3 or higher, we will need to follow up in three months to consider medication adjustments.

## 2023-09-06 LAB — CBC WITH DIFFERENTIAL/PLATELET
Basophils Absolute: 0.1 10*3/uL (ref 0.0–0.2)
Basos: 1 %
EOS (ABSOLUTE): 0.2 10*3/uL (ref 0.0–0.4)
Eos: 4 %
Hematocrit: 47.7 % (ref 37.5–51.0)
Hemoglobin: 16 g/dL (ref 13.0–17.7)
Immature Grans (Abs): 0 10*3/uL (ref 0.0–0.1)
Immature Granulocytes: 0 %
Lymphocytes Absolute: 2 10*3/uL (ref 0.7–3.1)
Lymphs: 31 %
MCH: 31.6 pg (ref 26.6–33.0)
MCHC: 33.5 g/dL (ref 31.5–35.7)
MCV: 94 fL (ref 79–97)
Monocytes Absolute: 0.7 10*3/uL (ref 0.1–0.9)
Monocytes: 11 %
Neutrophils Absolute: 3.3 10*3/uL (ref 1.4–7.0)
Neutrophils: 53 %
Platelets: 305 10*3/uL (ref 150–450)
RBC: 5.07 x10E6/uL (ref 4.14–5.80)
RDW: 12.2 % (ref 11.6–15.4)
WBC: 6.3 10*3/uL (ref 3.4–10.8)

## 2023-09-06 LAB — TSH: TSH: 2.69 u[IU]/mL (ref 0.450–4.500)

## 2023-09-06 LAB — LIPID PANEL
Chol/HDL Ratio: 3.1 ratio (ref 0.0–5.0)
Cholesterol, Total: 172 mg/dL (ref 100–199)
HDL: 55 mg/dL (ref >39)
LDL Chol Calc (NIH): 102 mg/dL — ABNORMAL HIGH (ref 0–99)
Triglycerides: 79 mg/dL (ref 0–149)
VLDL Cholesterol Cal: 15 mg/dL (ref 5–40)

## 2023-09-06 LAB — T4, FREE: Free T4: 1.38 ng/dL (ref 0.82–1.77)

## 2023-09-06 LAB — COMPREHENSIVE METABOLIC PANEL
ALT: 26 IU/L (ref 0–44)
AST: 26 IU/L (ref 0–40)
Albumin: 4.9 g/dL (ref 4.1–5.1)
Alkaline Phosphatase: 68 IU/L (ref 44–121)
BUN/Creatinine Ratio: 12 (ref 9–20)
BUN: 12 mg/dL (ref 6–24)
Bilirubin Total: 0.8 mg/dL (ref 0.0–1.2)
CO2: 27 mmol/L (ref 20–29)
Calcium: 9.9 mg/dL (ref 8.7–10.2)
Chloride: 92 mmol/L — ABNORMAL LOW (ref 96–106)
Creatinine, Ser: 0.98 mg/dL (ref 0.76–1.27)
Globulin, Total: 2.6 g/dL (ref 1.5–4.5)
Glucose: 97 mg/dL (ref 70–99)
Potassium: 4 mmol/L (ref 3.5–5.2)
Sodium: 134 mmol/L (ref 134–144)
Total Protein: 7.5 g/dL (ref 6.0–8.5)
eGFR: 98 mL/min/{1.73_m2} (ref >59)

## 2023-09-06 LAB — HEMOGLOBIN A1C
Est. average glucose Bld gHb Est-mCnc: 111 mg/dL
Hgb A1c MFr Bld: 5.5 % (ref 4.8–5.6)

## 2023-09-06 LAB — PARATHYROID HORMONE, INTACT (NO CA): PTH: 22 pg/mL (ref 15–65)

## 2023-09-10 ENCOUNTER — Other Ambulatory Visit: Payer: Self-pay | Admitting: Cardiology

## 2023-09-10 DIAGNOSIS — I10 Essential (primary) hypertension: Secondary | ICD-10-CM

## 2023-09-11 ENCOUNTER — Other Ambulatory Visit: Payer: Self-pay | Admitting: Physician Assistant

## 2023-09-11 DIAGNOSIS — F411 Generalized anxiety disorder: Secondary | ICD-10-CM

## 2023-10-10 ENCOUNTER — Ambulatory Visit (HOSPITAL_BASED_OUTPATIENT_CLINIC_OR_DEPARTMENT_OTHER): Payer: BC Managed Care – PPO | Admitting: Cardiology

## 2023-10-21 ENCOUNTER — Ambulatory Visit (HOSPITAL_BASED_OUTPATIENT_CLINIC_OR_DEPARTMENT_OTHER): Payer: BC Managed Care – PPO | Admitting: Cardiology

## 2023-10-21 ENCOUNTER — Encounter (HOSPITAL_BASED_OUTPATIENT_CLINIC_OR_DEPARTMENT_OTHER): Payer: Self-pay | Admitting: Cardiology

## 2023-10-21 VITALS — BP 130/82 | HR 61 | Ht 77.0 in | Wt 228.0 lb

## 2023-10-21 DIAGNOSIS — R002 Palpitations: Secondary | ICD-10-CM

## 2023-10-21 DIAGNOSIS — I10 Essential (primary) hypertension: Secondary | ICD-10-CM

## 2023-10-21 DIAGNOSIS — E7800 Pure hypercholesterolemia, unspecified: Secondary | ICD-10-CM

## 2023-10-21 DIAGNOSIS — Z7189 Other specified counseling: Secondary | ICD-10-CM | POA: Diagnosis not present

## 2023-10-21 DIAGNOSIS — I8390 Asymptomatic varicose veins of unspecified lower extremity: Secondary | ICD-10-CM | POA: Diagnosis not present

## 2023-10-21 NOTE — Progress Notes (Signed)
 Cardiology Office Note:  .   Date:  10/21/2023  ID:  Edward Terry, DOB [birth year], MRN [id number] PCP: Edward Bennett, PA   HeartCare Providers Cardiologist:  Sheryle Donning, MD {  History of Present Illness: .   Edward Terry is a 43 y.o. male with a hx of hypertension, hyperlipidemia  who is seen for follow up today. I initially met him as a new consult 01/08/19 at the request of Edward Terry,* for the evaluation and management of hypertension and hyperlipidemia.   Cardiovascular risk factors: Comorbid conditions: hypertension, hyperlipidemia. Tobacco use history: former smoker, quit >15 years ago Family history: Edward Terry had 3V bypass, mother has irregular heart rhythm  Today: Overall doing well. Had hernia surgery, recovered. Back to running, has been working hard on lifestyle. Running a 5k this weekend. Loves to cook, focuses on healthy food generally. Reviewed labs, LDL well controlled. Tolerating meds well.   Has intermittent trouble sleeping, takes clonazepam  PRN at bedtime. Has intermittent sensation of hard beats when lying on his left side, especially when anxious. Discussed likely etiology of this.   ROS: Denies chest pain, shortness of breath at rest or with normal exertion. No PND, orthopnea, LE edema or unexpected weight gain. No syncope. ROS otherwise negative except as noted.   Studies Reviewed: Edward Terry    EKG:  EKG Interpretation Date/Time:  Tuesday October 21 2023 08:11:23 EDT Ventricular Rate:  61 PR Interval:  136 QRS Duration:  88 QT Interval:  396 QTC Calculation: 398 R Axis:   58  Text Interpretation: Normal sinus rhythm Normal ECG Confirmed by Sheryle Donning [phone]) on 10/21/2023 8:18:35 AM    Physical Exam:   VS:  BP 130/82   Pulse 61   Ht 6\' 5"  (1.956 m)   Wt 228 lb (103.4 kg)   SpO2 99%   BMI 27.04 kg/m    Wt Readings from Last 3 Encounters:  10/21/23 228 lb (103.4 kg)  09/05/23 226 lb (102.5 kg)  05/29/23 231  lb (104.8 kg)    GEN: Well nourished, well developed in no acute distress HEENT: Normal, moist mucous membranes NECK: No JVD CARDIAC: regular rhythm, normal S1 and S2, no rubs or gallops. No murmur. VASCULAR: Radial and DP pulses 2+ bilaterally. No carotid bruits RESPIRATORY:  Clear to auscultation without rales, wheezing or rhonchi  ABDOMEN: Soft, non-tender, non-distended MUSCULOSKELETAL:  Ambulates independently SKIN: Warm and dry, no edema. Varicose veins noted bilateral lower extremities NEUROLOGIC:  Alert and oriented x 3. No focal neuro deficits noted. PSYCHIATRIC:  Normal affect    ASSESSMENT AND PLAN: .    Hypercholesterolemia:  -continue atorvastatin  10 mg daily -last LDL 102 07/2023 per [facility]. He is primary prevention, low ASCVD risk, reasonable goal 100-130.   Hypertension:  -continue chlorthalidone , lisinopril  -goal <130/80, reviewed checking home BP  Varicose veins -since his 55s. Good pulses, mild discoloration, discussed watching for signs of venous insuffficency, compression stockings  Palpitations/strong beats -at night, laying on left side, discussed likely etiology  -discussed vagal exercises to counteract sympathetic drive   CV risk counseling and prevention -recommend heart healthy/Mediterranean diet, with whole grains, fruits, vegetable, fish, lean meats, nuts, and olive oil. Limit salt. -recommend moderate walking, 3-5 times/week for 30-50 minutes each session. Aim for at least 150 minutes.week. Goal should be pace of 3 miles/hours, or walking 1.5 miles in 30 minutes -recommend avoidance of tobacco products. Avoid excess alcohol. -ASCVD risk score: The 10-year ASCVD risk score (Arnett DK, et al., 2019)  is: 1.5%   Values used to calculate the score:     Age: 67 years     Sex: Male     Is Non-Hispanic African American: No     Diabetic: No     Tobacco smoker: No     Systolic Blood Pressure: 130 mmHg     Is BP treated: Yes     HDL Cholesterol: 55 mg/dL      Total Cholesterol: 172 mg/dL    Dispo: He would like to check back in 6 mos for lifestyle check in, will stretch visits out from there.  Total time of encounter: I spent 41 minutes dedicated to the care of this patient on the date of this encounter to include pre-visit review of records, face-to-face time with the patient discussing conditions above, and clinical documentation with the electronic health record. We specifically spent time today discussing lifestyle, medications, CV risk, varicose veins, palpitations   Signed, Sheryle Donning, MD   Sheryle Donning, MD, PhD, [facility]  [facility]  [place]  [facility] at [facility] at [facility] [address], [address] [place], [place] [address] [phone]

## 2023-10-21 NOTE — Patient Instructions (Signed)
 Medication Instructions:  Your physician recommends that you continue on your current medications as directed. Please refer to the Current Medication list given to you today.   *If you need a refill on your cardiac medications before your next appointment, please call your pharmacy*  Follow-Up: At [facility], you and your health needs are our priority.  As part of our continuing mission to provide you with exceptional heart care, our providers are all part of one team.  This team includes your primary Cardiologist (physician) and Advanced Practice Providers or APPs (Physician Assistants and Nurse Practitioners) who all work together to provide you with the care you need, when you need it.  Please follow up in 6 months with Dr. Veryl Gottron, Slater Duncan, NP or Neomi Banks, NP   We recommend signing up for the patient portal called "MyChart".  Sign up information is provided on this After Visit Summary.  MyChart is used to connect with patients for Virtual Visits (Telemedicine).  Patients are able to view lab/test results, encounter notes, upcoming appointments, etc.  Non-urgent messages can be sent to your provider as well.   To learn more about what you can do with MyChart, go to [email or web address].

## 2023-10-29 ENCOUNTER — Other Ambulatory Visit: Payer: Self-pay | Admitting: Physician Assistant

## 2023-10-29 DIAGNOSIS — F411 Generalized anxiety disorder: Secondary | ICD-10-CM

## 2023-11-06 DIAGNOSIS — M65931 Unspecified synovitis and tenosynovitis, right forearm: Secondary | ICD-10-CM | POA: Diagnosis not present

## 2023-11-09 ENCOUNTER — Other Ambulatory Visit: Payer: Self-pay | Admitting: Cardiology

## 2023-11-09 DIAGNOSIS — I10 Essential (primary) hypertension: Secondary | ICD-10-CM

## 2024-02-16 ENCOUNTER — Other Ambulatory Visit: Payer: Self-pay | Admitting: Physician Assistant

## 2024-02-16 DIAGNOSIS — F411 Generalized anxiety disorder: Secondary | ICD-10-CM

## 2024-02-17 ENCOUNTER — Other Ambulatory Visit: Payer: Self-pay | Admitting: Family Medicine

## 2024-02-17 DIAGNOSIS — E782 Mixed hyperlipidemia: Secondary | ICD-10-CM

## 2024-03-12 ENCOUNTER — Ambulatory Visit: Admitting: Physician Assistant

## 2024-03-18 IMAGING — MR MR KNEE*R* W/O CM
7 series · 40 of 40 positions shown · non-contrast
Comparison: None Available.

CLINICAL DATA: Knee pain with lateral movement for 3-4 months. No
previous relevant surgery.

EXAM:
MRI OF THE RIGHT KNEE WITHOUT CONTRAST
TECHNIQUE: Multiplanar, multisequence MR imaging of the knee was performed. No
intravenous contrast was administered.

[Series 6: T2 fat-sat · axial · right · 4.0mm · 0.50mm/px · z∈[-50,+102]mm · 6 of 36 slices shown (1 of 3)]
[im 1/36]
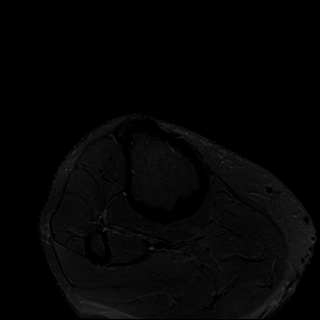
[im 8/36]
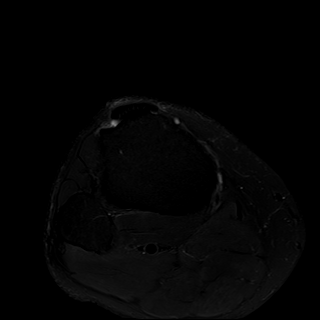
[im 15/36]
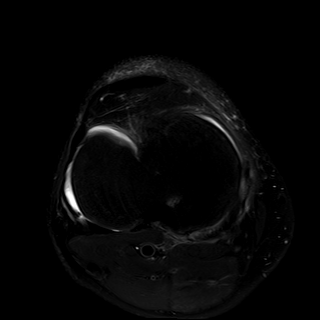
[im 22/36]
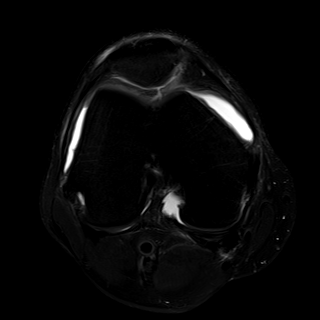
[im 29/36]
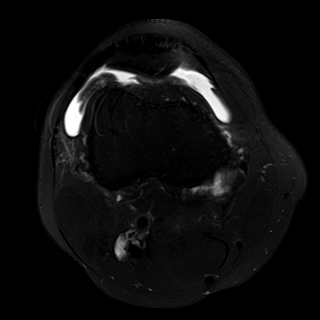
[im 36/36]
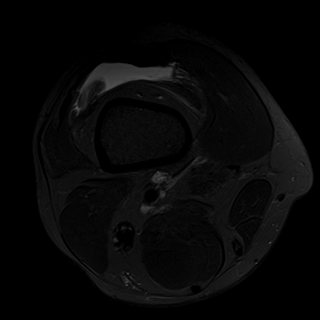

[Series 7: T2 fat-sat · coronal · right · 4.0mm · 0.47mm/px · 6 of 28 slices shown (2 of 3)]
[im 1/28]
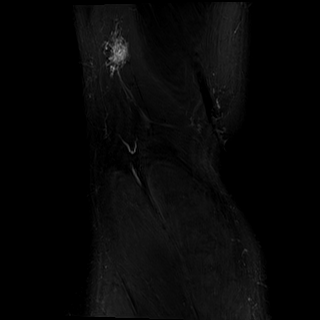
[im 6/28]
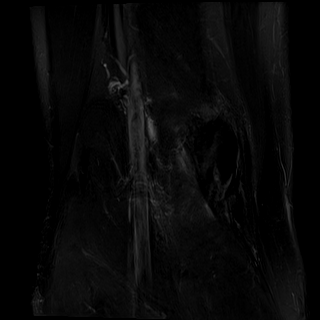
[im 11/28]
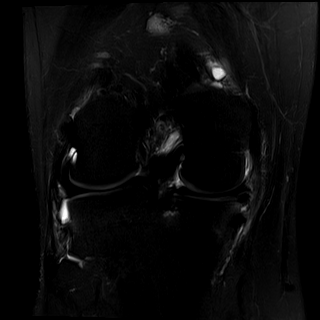
[im 17/28]
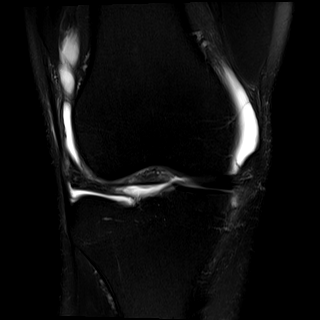
[im 22/28]
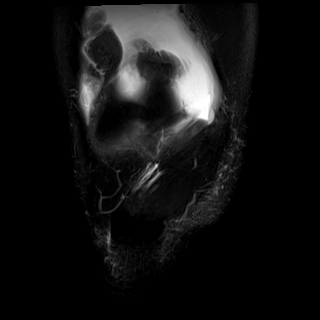
[im 28/28]
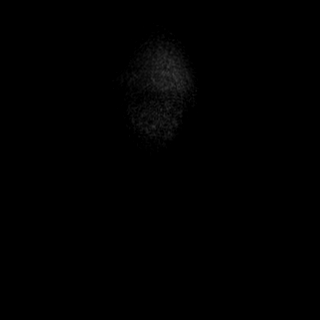

[Series 8: T1 · coronal · right · 4.0mm · 0.47mm/px · 6 of 28 slices shown]
[im 1/28]
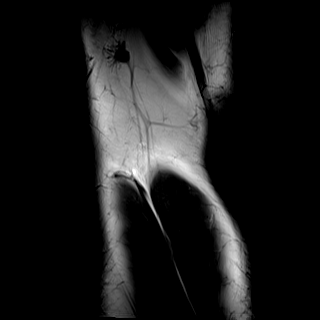
[im 6/28]
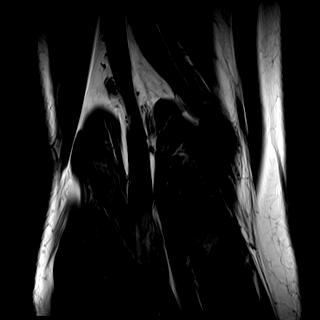
[im 11/28]
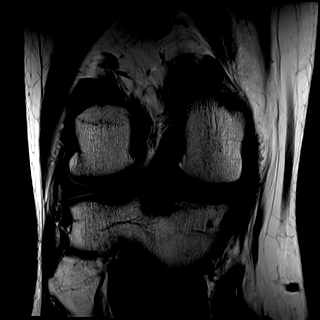
[im 17/28]
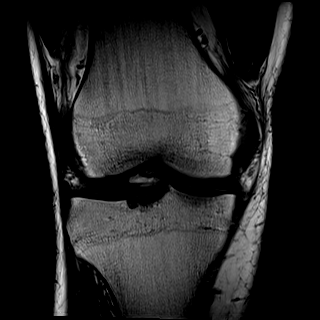
[im 22/28]
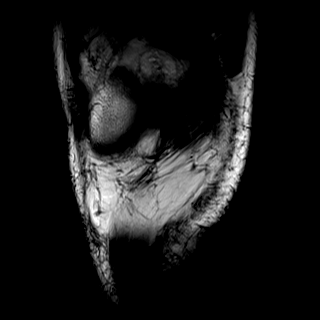
[im 28/28]
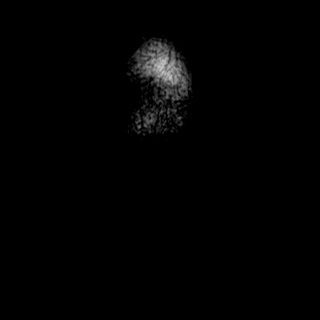

[Series 9: PD fat-sat · coronal · right · 3.0mm · 0.47mm/px · 7 of 33 slices shown (1 of 2)]
[im 1/33]
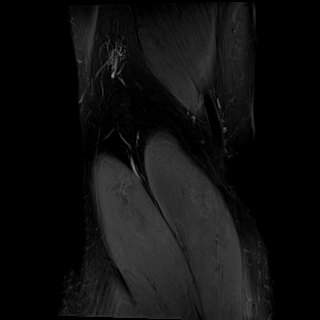
[im 6/33]
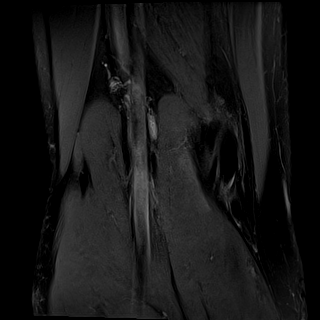
[im 11/33]
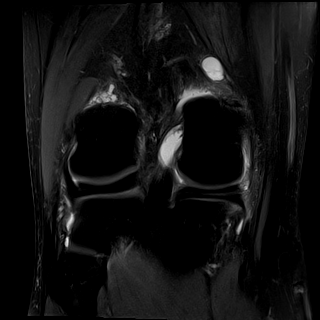
[im 17/33]
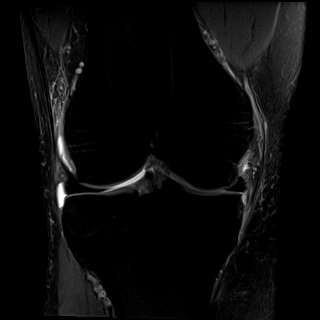
[im 22/33]
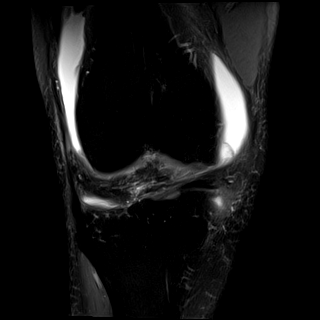
[im 27/33]
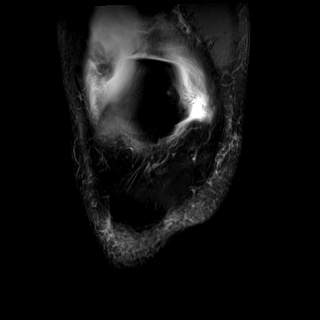
[im 33/33]
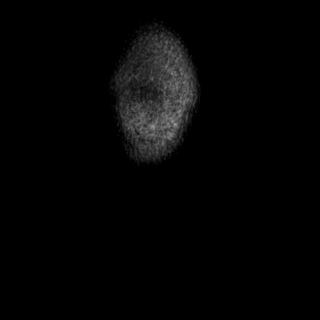

[Series 10: PD fat-sat · sagittal · right · 3.0mm · 0.47mm/px · 6 of 31 slices shown (2 of 2)]
[im 1/31]
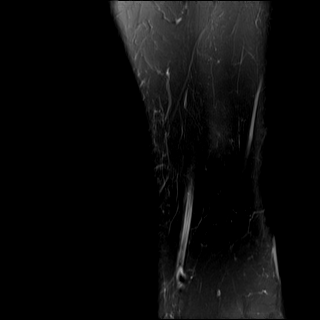
[im 7/31]
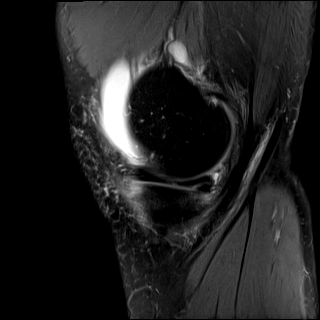
[im 13/31]
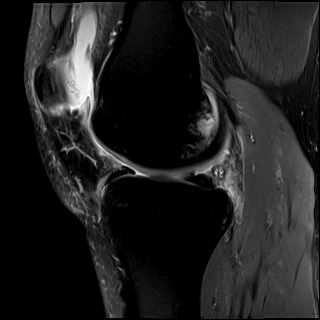
[im 19/31]
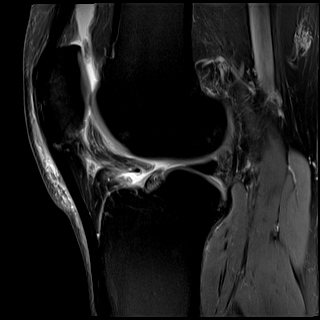
[im 25/31]
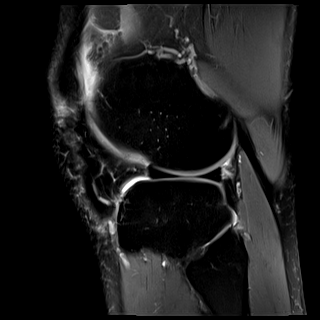
[im 31/31]
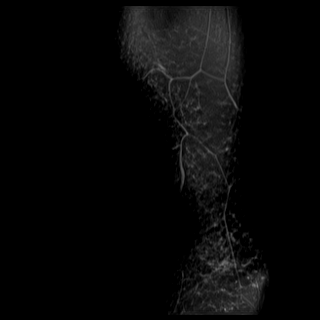

[Series 11: T2 fat-sat · sagittal · right · 3.0mm · 0.47mm/px · 6 of 31 slices shown (3 of 3)]
[im 1/31]
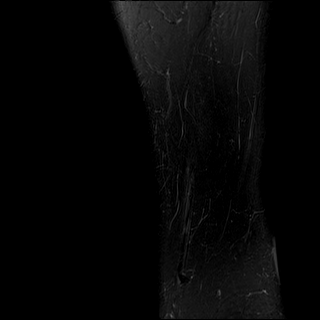
[im 7/31]
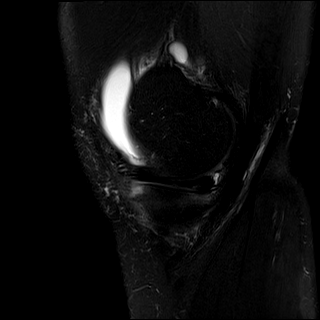
[im 13/31]
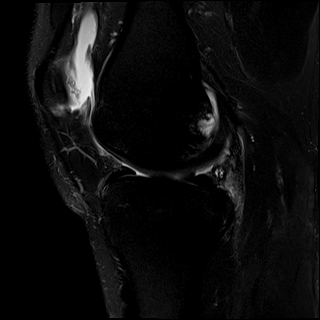
[im 19/31]
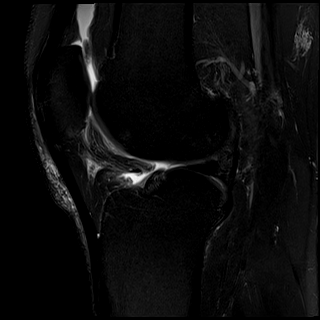
[im 25/31]
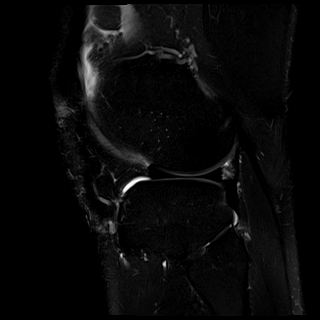
[im 31/31]
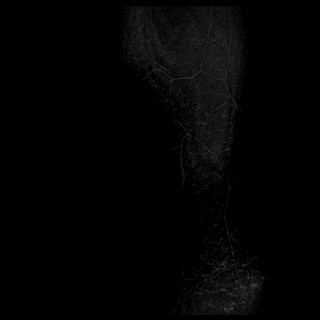

[Series 12: PD · oblique · right · 1.5mm · 0.44mm/px · 3 of 13 slices shown]
[im 1/13]
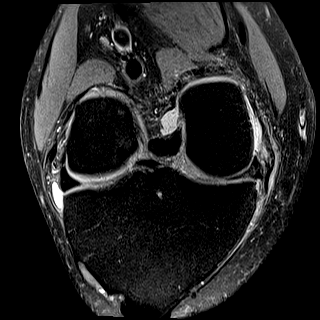
[im 7/13]
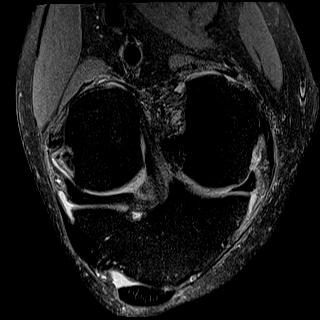
[im 13/13]
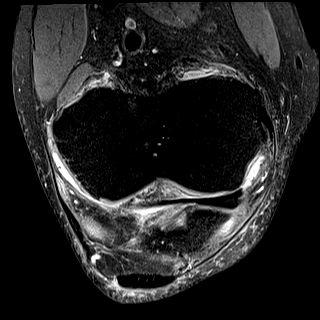

[40 of 40 positions shown; findings below may reference images not displayed]

FINDINGS: MENISCI

Medial meniscus: Oblique tear of the posterior horn extending to the
inferior articular surface, best seen on the sagittal images.
Possible tiny adjacent parameniscal cysts peripherally. In addition,
there is a possible small adjacent meniscal fragment or loose body
posteriorly, best seen on coronal image [DATE]. The meniscal root is
intact.

Lateral meniscus:  Intact with normal morphology.

LIGAMENTS

Cruciates:  Intact.

Collaterals: Intact. Mild MCL degeneration with small amount of
fluid in the pes anserine bursa.

CARTILAGE

Patellofemoral: Full-thickness chondral fissuring over the medial
facet with associated subchondral edema. Mild central trochlear
chondromalacia of with subchondral cyst formation.

Medial: Mild chondral thinning without focal defect or subchondral
signal abnormality.

Lateral:  Preserved.

MISCELLANEOUS

Joint: Moderate-sized joint effusion. As above, possible small
meniscal fragment or loose body posterior to the posterior horn of
the medial meniscus.

Popliteal Fossa: The popliteus muscle and tendon are intact. No
significant Baker's cyst.

Extensor Mechanism:  Intact.

Bones:  No acute or significant extra-articular osseous findings.

Other: Mildly thickened medial patellar plica. Mild prepatellar
subcutaneous edema.
IMPRESSION: 1. Oblique tear of the posterior horn of the medial meniscus with
possible adjacent tiny parameniscal cysts. In addition, there is a
possible small adjacent meniscal fragment or loose body posteriorly.
2. Intact lateral meniscus, cruciate and collateral ligaments.
3. Patellofemoral degenerative changes with focal fissuring of the
cartilage over the medial patellar facet.
4. Moderate-sized joint effusion.

## 2024-04-13 DIAGNOSIS — L308 Other specified dermatitis: Secondary | ICD-10-CM | POA: Diagnosis not present

## 2024-06-03 DIAGNOSIS — M65842 Other synovitis and tenosynovitis, left hand: Secondary | ICD-10-CM | POA: Diagnosis not present

## 2024-06-22 ENCOUNTER — Other Ambulatory Visit: Payer: Self-pay | Admitting: Physician Assistant

## 2024-06-22 DIAGNOSIS — F411 Generalized anxiety disorder: Secondary | ICD-10-CM

## 2024-06-29 ENCOUNTER — Other Ambulatory Visit: Payer: Self-pay | Admitting: Physician Assistant

## 2024-06-29 DIAGNOSIS — F411 Generalized anxiety disorder: Secondary | ICD-10-CM

## 2024-06-29 NOTE — Telephone Encounter (Signed)
 Copied from CRM [id number]. Topic: Clinical - Medication Refill >> Jun 29, 2024 11:22 AM Willma R wrote: Medication: clonazePAM  (KLONOPIN ) 0.5 MG tablet  Has the patient contacted their pharmacy? Yes, call dr   This is the patient's preferred pharmacy:  [facility] [id number] - [place], [place] - [address] [place] [address] Phone: [phone] Fax: [phone]  Is this the correct pharmacy for this prescription? Yes If no, delete pharmacy and type the correct one.   Has the prescription been filled recently? No  Is the patient out of the medication? No  Has the patient been seen for an appointment in the last year OR does the patient have an upcoming appointment? Yes Physical scheduled for 08/09/24  Can we respond through MyChart? No  Agent: Please be advised that Rx refills may take up to 3 business days. We ask that you follow-up with your pharmacy.

## 2024-07-22 ENCOUNTER — Telehealth: Payer: Self-pay

## 2024-07-22 ENCOUNTER — Other Ambulatory Visit: Payer: Self-pay | Admitting: Physician Assistant

## 2024-07-22 DIAGNOSIS — F411 Generalized anxiety disorder: Secondary | ICD-10-CM

## 2024-07-22 MED ORDER — CLONAZEPAM 0.5 MG PO TABS: 0.5000 mg | ORAL_TABLET | Freq: Every day | ORAL | 0 refills | Status: AC | PRN

## 2024-07-22 NOTE — Telephone Encounter (Signed)
 Copied from CRM #[id number]. Topic: Clinical - Prescription Issue >> Jul 22, 2024  3:17 PM Darshell M wrote: Reason for CRM: Patient's refill request for clonazepam  was denied. Per CAL provider wants to see patient in office before approving refill. Patient has appointment 08/09/2024 but will be without medication until then. Patient CB# [phone]

## 2024-08-09 ENCOUNTER — Encounter: Admitting: Physician Assistant
